# Patient Record
Sex: Female | Born: 1960 | Race: White | Hispanic: No | Marital: Single | Smoking: Never smoker
Health system: Southern US, Community
[De-identification: ages and names within clinical notes are randomized; demographics above are authoritative.]

## PROBLEM LIST (undated history)

## (undated) DIAGNOSIS — E079 Disorder of thyroid, unspecified: Secondary | ICD-10-CM

## (undated) HISTORY — PX: TONSILLECTOMY: SUR1361

## (undated) HISTORY — DX: Disorder of thyroid, unspecified: E07.9

---
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
## 1984-04-30 HISTORY — PX: NASAL SINUS SURGERY: SHX719

## 1984-04-30 HISTORY — PX: TUBAL LIGATION: SHX77

## 1997-10-03 ENCOUNTER — Emergency Department (HOSPITAL_COMMUNITY): Admission: EM | Admit: 1997-10-03 | Discharge: 1997-10-03 | Payer: Self-pay | Admitting: Emergency Medicine

## 1998-08-10 ENCOUNTER — Other Ambulatory Visit: Admission: RE | Admit: 1998-08-10 | Discharge: 1998-08-10 | Payer: Self-pay | Admitting: Obstetrics and Gynecology

## 1999-10-26 ENCOUNTER — Emergency Department (HOSPITAL_COMMUNITY): Admission: EM | Admit: 1999-10-26 | Discharge: 1999-10-26 | Payer: Self-pay | Admitting: Emergency Medicine

## 2000-02-15 ENCOUNTER — Encounter: Payer: Self-pay | Admitting: Emergency Medicine

## 2000-02-15 ENCOUNTER — Emergency Department (HOSPITAL_COMMUNITY): Admission: EM | Admit: 2000-02-15 | Discharge: 2000-02-15 | Payer: Self-pay | Admitting: Emergency Medicine

## 2000-06-03 ENCOUNTER — Emergency Department (HOSPITAL_COMMUNITY): Admission: EM | Admit: 2000-06-03 | Discharge: 2000-06-03 | Payer: Self-pay | Admitting: Emergency Medicine

## 2000-06-03 ENCOUNTER — Encounter: Payer: Self-pay | Admitting: Emergency Medicine

## 2001-09-08 ENCOUNTER — Ambulatory Visit (HOSPITAL_BASED_OUTPATIENT_CLINIC_OR_DEPARTMENT_OTHER): Admission: RE | Admit: 2001-09-08 | Discharge: 2001-09-08 | Payer: Self-pay | Admitting: *Deleted

## 2002-07-27 ENCOUNTER — Emergency Department (HOSPITAL_COMMUNITY): Admission: AD | Admit: 2002-07-27 | Discharge: 2002-07-27 | Payer: Self-pay | Admitting: Emergency Medicine

## 2002-07-30 ENCOUNTER — Other Ambulatory Visit: Admission: RE | Admit: 2002-07-30 | Discharge: 2002-07-30 | Payer: Self-pay | Admitting: Family Medicine

## 2002-08-04 ENCOUNTER — Encounter: Admission: RE | Admit: 2002-08-04 | Discharge: 2002-08-04 | Payer: Self-pay | Admitting: Family Medicine

## 2002-08-04 ENCOUNTER — Encounter: Payer: Self-pay | Admitting: Family Medicine

## 2003-01-02 ENCOUNTER — Emergency Department (HOSPITAL_COMMUNITY): Admission: EM | Admit: 2003-01-02 | Discharge: 2003-01-02 | Payer: Self-pay | Admitting: Emergency Medicine

## 2003-08-03 ENCOUNTER — Other Ambulatory Visit: Admission: RE | Admit: 2003-08-03 | Discharge: 2003-08-03 | Payer: Self-pay | Admitting: Family Medicine

## 2004-03-14 ENCOUNTER — Ambulatory Visit: Payer: Self-pay | Admitting: Family Medicine

## 2004-07-04 ENCOUNTER — Ambulatory Visit: Payer: Self-pay | Admitting: Family Medicine

## 2004-07-07 ENCOUNTER — Emergency Department (HOSPITAL_COMMUNITY): Admission: EM | Admit: 2004-07-07 | Discharge: 2004-07-07 | Payer: Self-pay | Admitting: Emergency Medicine

## 2004-07-10 ENCOUNTER — Ambulatory Visit: Payer: Self-pay | Admitting: Family Medicine

## 2004-07-12 ENCOUNTER — Encounter: Admission: RE | Admit: 2004-07-12 | Discharge: 2004-07-12 | Payer: Self-pay | Admitting: Family Medicine

## 2004-08-17 ENCOUNTER — Ambulatory Visit: Payer: Self-pay | Admitting: Family Medicine

## 2004-08-28 ENCOUNTER — Ambulatory Visit: Payer: Self-pay | Admitting: Family Medicine

## 2004-09-04 ENCOUNTER — Other Ambulatory Visit: Admission: RE | Admit: 2004-09-04 | Discharge: 2004-09-04 | Payer: Self-pay | Admitting: Family Medicine

## 2004-09-04 ENCOUNTER — Ambulatory Visit: Payer: Self-pay | Admitting: Family Medicine

## 2005-01-18 ENCOUNTER — Ambulatory Visit: Payer: Self-pay | Admitting: Family Medicine

## 2010-04-30 HISTORY — PX: ABDOMINAL HYSTERECTOMY: SHX81

## 2012-03-17 ENCOUNTER — Encounter: Payer: Self-pay | Admitting: Family Medicine

## 2012-03-18 ENCOUNTER — Encounter: Payer: Self-pay | Admitting: Family Medicine

## 2012-05-27 ENCOUNTER — Ambulatory Visit (INDEPENDENT_AMBULATORY_CARE_PROVIDER_SITE_OTHER): Payer: No Typology Code available for payment source | Admitting: Family Medicine

## 2012-05-27 ENCOUNTER — Encounter: Payer: Self-pay | Admitting: Family Medicine

## 2012-05-27 ENCOUNTER — Other Ambulatory Visit (HOSPITAL_COMMUNITY)
Admission: RE | Admit: 2012-05-27 | Discharge: 2012-05-27 | Disposition: A | Discharge status: Home / Self Care | Payer: No Typology Code available for payment source | Source: Ambulatory Visit | Attending: Family Medicine | Admitting: Family Medicine

## 2012-05-27 VITALS — BP 118/72 | HR 68 | Temp 97.9°F | Ht 64.25 in | Wt 179.2 lb

## 2012-05-27 DIAGNOSIS — Z1239 Encounter for other screening for malignant neoplasm of breast: Secondary | ICD-10-CM

## 2012-05-27 DIAGNOSIS — E039 Hypothyroidism, unspecified: Secondary | ICD-10-CM

## 2012-05-27 DIAGNOSIS — Z01419 Encounter for gynecological examination (general) (routine) without abnormal findings: Secondary | ICD-10-CM | POA: Insufficient documentation

## 2012-05-27 DIAGNOSIS — Z0000 Encounter for general adult medical examination without abnormal findings: Secondary | ICD-10-CM

## 2012-05-27 LAB — LIPID PANEL
Cholesterol: 154 mg/dL (ref 0–200)
HDL: 53.5 mg/dL (ref >39.00)
LDL Cholesterol: 91 mg/dL (ref 0–99)
Total CHOL/HDL Ratio: 3
Triglycerides: 48 mg/dL (ref 0.0–149.0)

## 2012-05-27 LAB — BASIC METABOLIC PANEL
BUN: 9 mg/dL (ref 6–23)
CO2: 29 mEq/L (ref 19–32)
Chloride: 102 mEq/L (ref 96–112)
Creatinine, Ser: 0.7 mg/dL (ref 0.4–1.2)
Glucose, Bld: 80 mg/dL (ref 70–99)
Potassium: 3.6 mEq/L (ref 3.5–5.1)

## 2012-05-27 LAB — CBC WITH DIFFERENTIAL/PLATELET
Eosinophils Absolute: 0.2 10*3/uL (ref 0.0–0.7)
Eosinophils Relative: 5.2 % — ABNORMAL HIGH (ref 0.0–5.0)
HCT: 37.8 % (ref 36.0–46.0)
Lymphs Abs: 1.4 10*3/uL (ref 0.7–4.0)
MCHC: 33.5 g/dL (ref 30.0–36.0)
MCV: 85.2 fl (ref 78.0–100.0)
Monocytes Absolute: 0.4 10*3/uL (ref 0.1–1.0)
Neutrophils Relative %: 55.6 % (ref 43.0–77.0)
Platelets: 245 10*3/uL (ref 150.0–400.0)
RDW: 14.8 % — ABNORMAL HIGH (ref 11.5–14.6)

## 2012-05-27 LAB — POCT URINALYSIS DIPSTICK
Bilirubin, UA: NEGATIVE
Blood, UA: NEGATIVE
Glucose, UA: NEGATIVE
Spec Grav, UA: <=1.005
Urobilinogen, UA: 0.2
pH, UA: 6

## 2012-05-27 LAB — HEPATIC FUNCTION PANEL
ALT: 45 U/L — ABNORMAL HIGH (ref 0–35)
Bilirubin, Direct: 0 mg/dL (ref 0.0–0.3)
Total Bilirubin: 0.5 mg/dL (ref 0.3–1.2)

## 2012-05-27 LAB — T3, FREE: T3, Free: 2.1 pg/mL — ABNORMAL LOW (ref 2.3–4.2)

## 2012-05-27 LAB — T4, FREE: Free T4: 0.94 ng/dL (ref 0.60–1.60)

## 2012-05-27 NOTE — Addendum Note (Signed)
Addended by: Arnette Norris on: 05/27/2012 11:31 AM   Modules accepted: Orders

## 2012-05-27 NOTE — Progress Notes (Signed)
  Subjective:     Gail Mccann is a 52 y.o. female and is here for a comprehensive physical exam. The patient reports no problems.  History   Social History  . Marital Status: Legally Separated    Spouse Name: N/A    Number of Children: N/A  . Years of Education: N/A   Occupational History  . [employer]    Social History Main Topics  . Smoking status: Never Smoker   . Smokeless tobacco: Never Used  . Alcohol Use: No  . Drug Use: No  . Sexually Active: Not on file   Other Topics Concern  . Not on file   Social History Narrative   Exercise-- walk   No health maintenance topics applied.  The following portions of the patient's history were reviewed and updated as appropriate: allergies, current medications, past family history, past medical history, past social history, past surgical history and problem list.  Review of Systems Review of Systems  Constitutional: Negative for activity change, appetite change and fatigue.  HENT: Negative for hearing loss, congestion, tinnitus and ear discharge.  dentist q59m Eyes: Negative for visual disturbance (see optho q1y -- vision corrected to 20/20 with glasses).  Respiratory: Negative for cough, chest tightness and shortness of breath.   Cardiovascular: Negative for chest pain, palpitations and leg swelling.  Gastrointestinal: Negative for abdominal pain, diarrhea, constipation and abdominal distention.  Genitourinary: Negative for urgency, frequency, decreased urine volume and difficulty urinating.  Musculoskeletal: Negative for back pain, arthralgias and gait problem.  Skin: Negative for color change, pallor and rash.  Neurological: Negative for dizziness, light-headedness, numbness and headaches.  Hematological: Negative for adenopathy. Does not bruise/bleed easily.  Psychiatric/Behavioral: Negative for suicidal ideas, confusion, sleep disturbance, self-injury, dysphoric mood, decreased concentration and agitation.       Objective:     BP 118/72  Pulse 68  Temp 97.9 F (36.6 C) (Oral)  Ht 5' 4.25" (1.632 m)  Wt 179 lb 3.2 oz (81.285 kg)  BMI 30.52 kg/m2  SpO2 99% General appearance: alert, cooperative, appears stated age and no distress Head: Normocephalic, without obvious abnormality, atraumatic Eyes: conjunctivae/corneas clear. PERRL, EOM's intact. Fundi benign. Ears: normal TM's and external ear canals both ears Nose: Nares normal. Septum midline. Mucosa normal. No drainage or sinus tenderness. Throat: lips, mucosa, and tongue normal; teeth and gums normal Neck: no adenopathy, no carotid bruit, no JVD, supple, symmetrical, trachea midline and thyroid not enlarged, symmetric, no tenderness/mass/nodules Back: symmetric, no curvature. ROM normal. No CVA tenderness. Lungs: clear to auscultation bilaterally Breasts: normal appearance, no masses or tenderness Heart: regular rate and rhythm, S1, S2 normal, no murmur, click, rub or gallop Abdomen: soft, non-tender; bowel sounds normal; no masses,  no organomegaly Pelvic: external genitalia normal, no adnexal masses or tenderness, rectovaginal septum normal, uterus surgically absent and vagina normal without discharge--vaginal smear done Extremities: extremities normal, atraumatic, no cyanosis or edema Pulses: 2+ and symmetric Skin: Skin color, texture, turgor normal. No rashes or lesions Lymph nodes: Cervical, supraclavicular, and axillary nodes normal. Neurologic: Alert and oriented X 3, normal strength and tone. Normal symmetric reflexes. Normal coordination and gait psych-- no depression, no anxiety    Assessment:    Healthy female exam.       Plan:    ghm utd Check labs See After Visit Summary for Counseling Recommendations

## 2012-05-27 NOTE — Assessment & Plan Note (Signed)
Check labs 

## 2012-05-27 NOTE — Patient Instructions (Signed)
Preventive Care for Adults, Female A healthy lifestyle and preventive care can promote health and wellness. Preventive health guidelines for women include the following key practices.  A routine yearly physical is a good way to check with your caregiver about your health and preventive screening. It is a chance to share any concerns and updates on your health, and to receive a thorough exam.  Visit your dentist for a routine exam and preventive care every 6 months. Brush your teeth twice a day and floss once a day. Good oral hygiene prevents tooth decay and gum disease.  The frequency of eye exams is based on your age, health, family medical history, use of contact lenses, and other factors. Follow your caregiver's recommendations for frequency of eye exams.  Eat a healthy diet. Foods like vegetables, fruits, whole grains, low-fat dairy products, and lean protein foods contain the nutrients you need without too many calories. Decrease your intake of foods high in solid fats, added sugars, and salt. Eat the right amount of calories for you.Get information about a proper diet from your caregiver, if necessary.  Regular physical exercise is one of the most important things you can do for your health. Most adults should get at least 150 minutes of moderate-intensity exercise (any activity that increases your heart rate and causes you to sweat) each week. In addition, most adults need muscle-strengthening exercises on 2 or more days a week.  Maintain a healthy weight. The body mass index (BMI) is a screening tool to identify possible weight problems. It provides an estimate of body fat based on height and weight. Your caregiver can help determine your BMI, and can help you achieve or maintain a healthy weight.For adults 20 years and older:  A BMI below 18.5 is considered underweight.  A BMI of 18.5 to 24.9 is normal.  A BMI of 25 to 29.9 is considered overweight.  A BMI of 30 and above is  considered obese.  Maintain normal blood lipids and cholesterol levels by exercising and minimizing your intake of saturated fat. Eat a balanced diet with plenty of fruit and vegetables. Blood tests for lipids and cholesterol should begin at age 20 and be repeated every 5 years. If your lipid or cholesterol levels are high, you are over 50, or you are at high risk for heart disease, you may need your cholesterol levels checked more frequently.Ongoing high lipid and cholesterol levels should be treated with medicines if diet and exercise are not effective.  If you smoke, find out from your caregiver how to quit. If you do not use tobacco, do not start.  If you are pregnant, do not drink alcohol. If you are breastfeeding, be very cautious about drinking alcohol. If you are not pregnant and choose to drink alcohol, do not exceed 1 drink per day. One drink is considered to be 12 ounces (355 mL) of beer, 5 ounces (148 mL) of wine, or 1.5 ounces (44 mL) of liquor.  Avoid use of street drugs. Do not share needles with anyone. Ask for help if you need support or instructions about stopping the use of drugs.  High blood pressure causes heart disease and increases the risk of stroke. Your blood pressure should be checked at least every 1 to 2 years. Ongoing high blood pressure should be treated with medicines if weight loss and exercise are not effective.  If you are 55 to 54 years old, ask your caregiver if you should take aspirin to prevent strokes.  Diabetes   screening involves taking a blood sample to check your fasting blood sugar level. This should be done once every 3 years, after age 45, if you are within normal weight and without risk factors for diabetes. Testing should be considered at a younger age or be carried out more frequently if you are overweight and have at least 1 risk factor for diabetes.  Breast cancer screening is essential preventive care for women. You should practice "breast  self-awareness." This means understanding the normal appearance and feel of your breasts and may include breast self-examination. Any changes detected, no matter how small, should be reported to a caregiver. Women in their 20s and 30s should have a clinical breast exam (CBE) by a caregiver as part of a regular health exam every 1 to 3 years. After age 40, women should have a CBE every year. Starting at age 40, women should consider having a mammography (breast X-ray test) every year. Women who have a family history of breast cancer should talk to their caregiver about genetic screening. Women at a high risk of breast cancer should talk to their caregivers about having magnetic resonance imaging (MRI) and a mammography every year.  The Pap test is a screening test for cervical cancer. A Pap test can show cell changes on the cervix that might become cervical cancer if left untreated. A Pap test is a procedure in which cells are obtained and examined from the lower end of the uterus (cervix).  Women should have a Pap test starting at age 21.  Between ages 21 and 29, Pap tests should be repeated every 2 years.  Beginning at age 30, you should have a Pap test every 3 years as long as the past 3 Pap tests have been normal.  Some women have medical problems that increase the chance of getting cervical cancer. Talk to your caregiver about these problems. It is especially important to talk to your caregiver if a new problem develops soon after your last Pap test. In these cases, your caregiver may recommend more frequent screening and Pap tests.  The above recommendations are the same for women who have or have not gotten the vaccine for human papillomavirus (HPV).  If you had a hysterectomy for a problem that was not cancer or a condition that could lead to cancer, then you no longer need Pap tests. Even if you no longer need a Pap test, a regular exam is a good idea to make sure no other problems are  starting.  If you are between ages 65 and 70, and you have had normal Pap tests going back 10 years, you no longer need Pap tests. Even if you no longer need a Pap test, a regular exam is a good idea to make sure no other problems are starting.  If you have had past treatment for cervical cancer or a condition that could lead to cancer, you need Pap tests and screening for cancer for at least 20 years after your treatment.  If Pap tests have been discontinued, risk factors (such as a new sexual partner) need to be reassessed to determine if screening should be resumed.  The HPV test is an additional test that may be used for cervical cancer screening. The HPV test looks for the virus that can cause the cell changes on the cervix. The cells collected during the Pap test can be tested for HPV. The HPV test could be used to screen women aged 30 years and older, and should   be used in women of any age who have unclear Pap test results. After the age of 30, women should have HPV testing at the same frequency as a Pap test.  Colorectal cancer can be detected and often prevented. Most routine colorectal cancer screening begins at the age of 50 and continues through age 75. However, your caregiver may recommend screening at an earlier age if you have risk factors for colon cancer. On a yearly basis, your caregiver may provide home test kits to check for hidden blood in the stool. Use of a small camera at the end of a tube, to directly examine the colon (sigmoidoscopy or colonoscopy), can detect the earliest forms of colorectal cancer. Talk to your caregiver about this at age 50, when routine screening begins. Direct examination of the colon should be repeated every 5 to 10 years through age 75, unless early forms of pre-cancerous polyps or small growths are found.  Hepatitis C blood testing is recommended for all people born from 1945 through 1965 and any individual with known risks for hepatitis C.  Practice  safe sex. Use condoms and avoid high-risk sexual practices to reduce the spread of sexually transmitted infections (STIs). STIs include gonorrhea, chlamydia, syphilis, trichomonas, herpes, HPV, and human immunodeficiency virus (HIV). Herpes, HIV, and HPV are viral illnesses that have no cure. They can result in disability, cancer, and death. Sexually active women aged 25 and younger should be checked for chlamydia. Older women with new or multiple partners should also be tested for chlamydia. Testing for other STIs is recommended if you are sexually active and at increased risk.  Osteoporosis is a disease in which the bones lose minerals and strength with aging. This can result in serious bone fractures. The risk of osteoporosis can be identified using a bone density scan. Women ages 65 and over and women at risk for fractures or osteoporosis should discuss screening with their caregivers. Ask your caregiver whether you should take a calcium supplement or vitamin D to reduce the rate of osteoporosis.  Menopause can be associated with physical symptoms and risks. Hormone replacement therapy is available to decrease symptoms and risks. You should talk to your caregiver about whether hormone replacement therapy is right for you.  Use sunscreen with sun protection factor (SPF) of 30 or more. Apply sunscreen liberally and repeatedly throughout the day. You should seek shade when your shadow is shorter than you. Protect yourself by wearing long sleeves, pants, a wide-brimmed hat, and sunglasses year round, whenever you are outdoors.  Once a month, do a whole body skin exam, using a mirror to look at the skin on your back. Notify your caregiver of new moles, moles that have irregular borders, moles that are larger than a pencil eraser, or moles that have changed in shape or color.  Stay current with required immunizations.  Influenza. You need a dose every fall (or winter). The composition of the flu vaccine  changes each year, so being vaccinated once is not enough.  Pneumococcal polysaccharide. You need 1 to 2 doses if you smoke cigarettes or if you have certain chronic medical conditions. You need 1 dose at age 65 (or older) if you have never been vaccinated.  Tetanus, diphtheria, pertussis (Tdap, Td). Get 1 dose of Tdap vaccine if you are younger than age 65, are over 65 and have contact with an infant, are a healthcare worker, are pregnant, or simply want to be protected from whooping cough. After that, you need a Td   booster dose every 10 years. Consult your caregiver if you have not had at least 3 tetanus and diphtheria-containing shots sometime in your life or have a deep or dirty wound.  HPV. You need this vaccine if you are a woman age 26 or younger. The vaccine is given in 3 doses over 6 months.  Measles, mumps, rubella (MMR). You need at least 1 dose of MMR if you were born in 1957 or later. You may also need a second dose.  Meningococcal. If you are age 19 to 21 and a first-year college student living in a residence hall, or have one of several medical conditions, you need to get vaccinated against meningococcal disease. You may also need additional booster doses.  Zoster (shingles). If you are age 60 or older, you should get this vaccine.  Varicella (chickenpox). If you have never had chickenpox or you were vaccinated but received only 1 dose, talk to your caregiver to find out if you need this vaccine.  Hepatitis A. You need this vaccine if you have a specific risk factor for hepatitis A virus infection or you simply wish to be protected from this disease. The vaccine is usually given as 2 doses, 6 to 18 months apart.  Hepatitis B. You need this vaccine if you have a specific risk factor for hepatitis B virus infection or you simply wish to be protected from this disease. The vaccine is given in 3 doses, usually over 6 months. Preventive Services / Frequency Ages 19 to 39  Blood  pressure check.** / Every 1 to 2 years.  Lipid and cholesterol check.** / Every 5 years beginning at age 20.  Clinical breast exam.** / Every 3 years for women in their 20s and 30s.  Pap test.** / Every 2 years from ages 21 through 29. Every 3 years starting at age 30 through age 65 or 70 with a history of 3 consecutive normal Pap tests.  HPV screening.** / Every 3 years from ages 30 through ages 65 to 70 with a history of 3 consecutive normal Pap tests.  Hepatitis C blood test.** / For any individual with known risks for hepatitis C.  Skin self-exam. / Monthly.  Influenza immunization.** / Every year.  Pneumococcal polysaccharide immunization.** / 1 to 2 doses if you smoke cigarettes or if you have certain chronic medical conditions.  Tetanus, diphtheria, pertussis (Tdap, Td) immunization. / A one-time dose of Tdap vaccine. After that, you need a Td booster dose every 10 years.  HPV immunization. / 3 doses over 6 months, if you are 26 and younger.  Measles, mumps, rubella (MMR) immunization. / You need at least 1 dose of MMR if you were born in 1957 or later. You may also need a second dose.  Meningococcal immunization. / 1 dose if you are age 19 to 21 and a first-year college student living in a residence hall, or have one of several medical conditions, you need to get vaccinated against meningococcal disease. You may also need additional booster doses.  Varicella immunization.** / Consult your caregiver.  Hepatitis A immunization.** / Consult your caregiver. 2 doses, 6 to 18 months apart.  Hepatitis B immunization.** / Consult your caregiver. 3 doses usually over 6 months. Ages 40 to 64  Blood pressure check.** / Every 1 to 2 years.  Lipid and cholesterol check.** / Every 5 years beginning at age 20.  Clinical breast exam.** / Every year after age 40.  Mammogram.** / Every year beginning at age 40   and continuing for as long as you are in good health. Consult with your  caregiver.  Pap test.** / Every 3 years starting at age 30 through age 65 or 70 with a history of 3 consecutive normal Pap tests.  HPV screening.** / Every 3 years from ages 30 through ages 65 to 70 with a history of 3 consecutive normal Pap tests.  Fecal occult blood test (FOBT) of stool. / Every year beginning at age 50 and continuing until age 75. You may not need to do this test if you get a colonoscopy every 10 years.  Flexible sigmoidoscopy or colonoscopy.** / Every 5 years for a flexible sigmoidoscopy or every 10 years for a colonoscopy beginning at age 50 and continuing until age 75.  Hepatitis C blood test.** / For all people born from 1945 through 1965 and any individual with known risks for hepatitis C.  Skin self-exam. / Monthly.  Influenza immunization.** / Every year.  Pneumococcal polysaccharide immunization.** / 1 to 2 doses if you smoke cigarettes or if you have certain chronic medical conditions.  Tetanus, diphtheria, pertussis (Tdap, Td) immunization.** / A one-time dose of Tdap vaccine. After that, you need a Td booster dose every 10 years.  Measles, mumps, rubella (MMR) immunization. / You need at least 1 dose of MMR if you were born in 1957 or later. You may also need a second dose.  Varicella immunization.** / Consult your caregiver.  Meningococcal immunization.** / Consult your caregiver.  Hepatitis A immunization.** / Consult your caregiver. 2 doses, 6 to 18 months apart.  Hepatitis B immunization.** / Consult your caregiver. 3 doses, usually over 6 months. Ages 65 and over  Blood pressure check.** / Every 1 to 2 years.  Lipid and cholesterol check.** / Every 5 years beginning at age 20.  Clinical breast exam.** / Every year after age 40.  Mammogram.** / Every year beginning at age 40 and continuing for as long as you are in good health. Consult with your caregiver.  Pap test.** / Every 3 years starting at age 30 through age 65 or 70 with a 3  consecutive normal Pap tests. Testing can be stopped between 65 and 70 with 3 consecutive normal Pap tests and no abnormal Pap or HPV tests in the past 10 years.  HPV screening.** / Every 3 years from ages 30 through ages 65 or 70 with a history of 3 consecutive normal Pap tests. Testing can be stopped between 65 and 70 with 3 consecutive normal Pap tests and no abnormal Pap or HPV tests in the past 10 years.  Fecal occult blood test (FOBT) of stool. / Every year beginning at age 50 and continuing until age 75. You may not need to do this test if you get a colonoscopy every 10 years.  Flexible sigmoidoscopy or colonoscopy.** / Every 5 years for a flexible sigmoidoscopy or every 10 years for a colonoscopy beginning at age 50 and continuing until age 75.  Hepatitis C blood test.** / For all people born from 1945 through 1965 and any individual with known risks for hepatitis C.  Osteoporosis screening.** / A one-time screening for women ages 65 and over and women at risk for fractures or osteoporosis.  Skin self-exam. / Monthly.  Influenza immunization.** / Every year.  Pneumococcal polysaccharide immunization.** / 1 dose at age 65 (or older) if you have never been vaccinated.  Tetanus, diphtheria, pertussis (Tdap, Td) immunization. / A one-time dose of Tdap vaccine if you are over   65 and have contact with an infant, are a healthcare worker, or simply want to be protected from whooping cough. After that, you need a Td booster dose every 10 years.  Varicella immunization.** / Consult your caregiver.  Meningococcal immunization.** / Consult your caregiver.  Hepatitis A immunization.** / Consult your caregiver. 2 doses, 6 to 18 months apart.  Hepatitis B immunization.** / Check with your caregiver. 3 doses, usually over 6 months. ** Family history and personal history of risk and conditions may change your caregiver's recommendations. Document Released: 06/12/2001 Document Revised: 07/09/2011  Document Reviewed: 09/11/2010 [employer] Patient Information 2013 [employer].  

## 2012-05-28 ENCOUNTER — Encounter: Payer: Self-pay | Admitting: Gastroenterology

## 2012-05-29 ENCOUNTER — Telehealth: Payer: Self-pay | Admitting: Family Medicine

## 2012-05-29 MED ORDER — LEVOTHYROXINE SODIUM 75 MCG PO TABS: 75.0000 ug | ORAL_TABLET | Freq: Every day | ORAL | Status: DC

## 2012-05-29 NOTE — Telephone Encounter (Signed)
Pt denies that she is taking any meds to cause a elevation in hep panel. Pt notes that every time she has this done her levels seem to be elevated in the past.   Hep panel elevated---- any etoh, tylenol or other otc meds? If yes, Stop it and recheck 2 weeks ---790.4 Hep, ggt, acute hep con't current thyroid med

## 2012-05-29 NOTE — Telephone Encounter (Signed)
Pt returned you call, she is going to work at [employer] and hoped you would call before this.

## 2012-06-17 ENCOUNTER — Ambulatory Visit (INDEPENDENT_AMBULATORY_CARE_PROVIDER_SITE_OTHER): Payer: No Typology Code available for payment source

## 2012-06-17 DIAGNOSIS — Z1231 Encounter for screening mammogram for malignant neoplasm of breast: Secondary | ICD-10-CM

## 2012-06-17 DIAGNOSIS — Z1239 Encounter for other screening for malignant neoplasm of breast: Secondary | ICD-10-CM

## 2012-07-01 ENCOUNTER — Encounter: Payer: No Typology Code available for payment source | Admitting: Gastroenterology

## 2012-12-09 ENCOUNTER — Telehealth: Payer: Self-pay | Admitting: Family Medicine

## 2012-12-09 MED ORDER — LEVOTHYROXINE SODIUM 75 MCG PO TABS: 75.0000 ug | ORAL_TABLET | Freq: Every day | ORAL | Status: DC

## 2012-12-09 NOTE — Telephone Encounter (Signed)
Patient is calling in to request a refill on her Synthroid rx. Wants to know if she needs to come in for a thyroid panel. She says that she has to pay out-of-pocket for her visits so she prefers not to make an appointment to see the doctor. States that we can reach her back at [phone] and leave a detailed message if she does not answer.

## 2012-12-09 NOTE — Telephone Encounter (Signed)
Her TSH is not due until Jan.   KP

## 2013-01-13 ENCOUNTER — Telehealth: Payer: Self-pay | Admitting: Family Medicine

## 2013-01-13 NOTE — Telephone Encounter (Signed)
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 Her employer is asking that she get a note from her physician stating this so she can have frequent bathroom breaks. Pt will come pick up. Okay to leave a message, patient cannot answer phone at work. Patient gets off work @ 3:30pm

## 2013-01-14 NOTE — Telephone Encounter (Signed)
Please advise. There is no mention of this in pt's chart, kim also looked in centric[facility] and pt has no record on file.

## 2013-01-14 NOTE — Telephone Encounter (Signed)
Let pt know we have no record of testing ---does she know where and when they were done?

## 2013-01-14 NOTE — Telephone Encounter (Signed)
Pt returned call stating that she had seen [facility] in [facility] 4-5 years ago and was seen the urology office there.

## 2013-01-14 NOTE — Telephone Encounter (Signed)
There is no [facility] in [employer]

## 2013-01-14 NOTE — Telephone Encounter (Signed)
Called and Left a message for pt to return call.

## 2013-01-19 NOTE — Telephone Encounter (Signed)
Spoke with pt and advised we had no record of this. Advised that we would have to begin testing all over again. Pt stated that would think about it.

## 2013-06-30 ENCOUNTER — Other Ambulatory Visit: Payer: Self-pay | Admitting: Family Medicine

## 2013-07-21 ENCOUNTER — Encounter: Payer: Self-pay | Admitting: Family Medicine

## 2013-07-21 ENCOUNTER — Ambulatory Visit (INDEPENDENT_AMBULATORY_CARE_PROVIDER_SITE_OTHER): Payer: No Typology Code available for payment source | Admitting: Family Medicine

## 2013-07-21 VITALS — BP 100/60 | HR 68 | Temp 97.6°F | Ht 64.25 in | Wt 209.1 lb

## 2013-07-21 DIAGNOSIS — J209 Acute bronchitis, unspecified: Secondary | ICD-10-CM

## 2013-07-21 MED ORDER — DOXYCYCLINE HYCLATE 100 MG PO TABS: 100.0000 mg | ORAL_TABLET | Freq: Two times a day (BID) | ORAL | Status: DC

## 2013-07-21 NOTE — Patient Instructions (Signed)
Probiotics daily for one month, a generic or Digestive Advantage  Bronchitis Bronchitis is inflammation of the airways that extend from the windpipe into the lungs (bronchi). The inflammation often causes mucus to develop, which leads to a cough. If the inflammation becomes severe, it may cause shortness of breath. CAUSES  Bronchitis may be caused by:   Viral infections.   Bacteria.   Cigarette smoke.   Allergens, pollutants, and other irritants.  SIGNS AND SYMPTOMS  The most common symptom of bronchitis is a frequent cough that produces mucus. Other symptoms include:  Fever.   Body aches.   Chest congestion.   Chills.   Shortness of breath.   Sore throat.  DIAGNOSIS  Bronchitis is usually diagnosed through a medical history and physical exam. Tests, such as chest X-rays, are sometimes done to rule out other conditions.  TREATMENT  You may need to avoid contact with whatever caused the problem (smoking, for example). Medicines are sometimes needed. These may include:  Antibiotics. These may be prescribed if the condition is caused by bacteria.  Cough suppressants. These may be prescribed for relief of cough symptoms.   Inhaled medicines. These may be prescribed to help open your airways and make it easier for you to breathe.   Steroid medicines. These may be prescribed for those with recurrent (chronic) bronchitis. HOME CARE INSTRUCTIONS  Get plenty of rest.   Drink enough fluids to keep your urine clear or pale yellow (unless you have a medical condition that requires fluid restriction). Increasing fluids may help thin your secretions and will prevent dehydration.   Only take over-the-counter or prescription medicines as directed by your health care provider.  Only take antibiotics as directed. Make sure you finish them even if you start to feel better.  Avoid secondhand smoke, irritating chemicals, and strong fumes. These will make bronchitis worse.  If you are a smoker, quit smoking. Consider using nicotine gum or skin patches to help control withdrawal symptoms. Quitting smoking will help your lungs heal faster.   Put a cool-mist humidifier in your bedroom at night to moisten the air. This may help loosen mucus. Change the water in the humidifier daily. You can also run the hot water in your shower and sit in the bathroom with the door closed for 5 10 minutes.   Follow up with your health care provider as directed.   Wash your hands frequently to avoid catching bronchitis again or spreading an infection to others.  SEEK MEDICAL CARE IF: Your symptoms do not improve after 1 week of treatment.  SEEK IMMEDIATE MEDICAL CARE IF:  Your fever increases.  You have chills.   You have chest pain.   You have worsening shortness of breath.   You have bloody sputum.  You faint.  You have lightheadedness.  You have a severe headache.   You vomit repeatedly. MAKE SURE YOU:   Understand these instructions.  Will watch your condition.  Will get help right away if you are not doing well or get worse. Document Released: 04/16/2005 Document Revised: 02/04/2013 Document Reviewed: 12/09/2012 [facility] Patient Information 2014 [place], [place].

## 2013-07-22 ENCOUNTER — Telehealth: Payer: Self-pay

## 2013-07-22 NOTE — Telephone Encounter (Signed)
Can have Hydromet 1 tsp po bid prn cough, disp #140 ML

## 2013-07-22 NOTE — Telephone Encounter (Signed)
The patient was seen on 07/21/13 by Dr. Abner GreenspanBlyth.  The patient states she was offered something for a cough, but did not need at the time.  She states now is coughing constantly and chest is hurting.  The [facility] [place] [facility] [place] [facility] [place]   Patient call back number once sent in is [phone].  Advise please

## 2013-07-23 MED ORDER — HYDROCODONE-HOMATROPINE 5-1.5 MG/5ML PO SYRP: 5.0000 mL | ORAL_SOLUTION | Freq: Two times a day (BID) | ORAL | Status: DC | PRN

## 2013-07-23 NOTE — Addendum Note (Signed)
Addended by: Court JoyFREEMAN, Matilda Fleig L on: 07/23/2013 04:56 PM   Modules accepted: Orders

## 2013-07-23 NOTE — Telephone Encounter (Signed)
RX sent and Candise BowensJen informed pt

## 2013-07-26 ENCOUNTER — Encounter: Payer: Self-pay | Admitting: Family Medicine

## 2013-07-26 DIAGNOSIS — J209 Acute bronchitis, unspecified: Secondary | ICD-10-CM | POA: Insufficient documentation

## 2013-07-26 NOTE — Progress Notes (Signed)
Patient ID: Gail Mccann, female   DOB: [birth year], 53 y.o.   MRN: [id number] Gail Mccann [id number] [birth year] 07/26/2013      Progress Note-Follow Up  Subjective  Chief Complaint  Chief Complaint  Patient presents with  . Cough    for 2 wk, chest congestion, body aching, fatigue.  took Dayquil but not working.    HPI  Patient is a 53 year old female in today for routine medical care. In today complaining of 2 weeks worth of worsening cough, malaise and fatigue. She is struggling with some rhinorrhea which can be green at times. She's had some intermittent episodes of wheezing most notably when lying down. She did initially have some nausea with one episode of vomiting but that improved. Has a sense of feeling tight and short of breath at times. She has used DayQuil and NyQuil with some relief. Neck with to help her sleep last night. Denies throat pain or ear pain. Denies headache, GI or GU concerns at this time.  Past Medical History  Diagnosis Date  . Thyroid disease     Past Surgical History  Procedure Laterality Date  . Tubal ligation  1986  . Cesarean section      x's 2  . Abdominal hysterectomy  2012  . Tonsillectomy  1968  . Nasal sinus surgery  1986    Family History  Problem Relation Age of Onset  . Breast cancer Maternal Aunt   . Diabetes Maternal Grandmother   . Kidney disease Son     History   Social History  . Marital Status: Legally Separated    Spouse Name: N/A    Number of Children: N/A  . Years of Education: N/A   Occupational History  . [employer]    Social History Main Topics  . Smoking status: Never Smoker   . Smokeless tobacco: Never Used  . Alcohol Use: No  . Drug Use: No  . Sexual Activity: Not on file   Other Topics Concern  . Not on file   Social History Narrative   Exercise-- walk    Current Outpatient Prescriptions on File Prior to Visit  Medication Sig Dispense Refill  . Biotin 5000 MCG CAPS Take by mouth.      .  levothyroxine (SYNTHROID, LEVOTHROID) 75 MCG tablet TAKE ONE TABLET BY MOUTH ONCE DAILY  30 tablet  2  . Multiple Vitamin (MULTIVITAMIN) tablet Take 1 tablet by mouth daily.      . Calcium Carbonate-Vitamin D (CALCIUM 600 + D PO) Take by mouth.      . Melatonin 5 MG TABS Take by mouth.      Satira Sark. St Johns Wort 300 MG CAPS Take by mouth.       No current facility-administered medications on file prior to visit.    No Known Allergies  Review of Systems  Review of Systems  Constitutional: Positive for malaise/fatigue. Negative for fever.  HENT: Positive for congestion.   Eyes: Negative for discharge.  Respiratory: Positive for cough. Negative for shortness of breath.   Cardiovascular: Negative for chest pain, palpitations and leg swelling.  Gastrointestinal: Negative for nausea, abdominal pain and diarrhea.  Genitourinary: Negative for dysuria.  Musculoskeletal: Positive for myalgias. Negative for falls.  Skin: Negative for rash.  Neurological: Negative for loss of consciousness and headaches.  Endo/Heme/Allergies: Negative for polydipsia.  Psychiatric/Behavioral: Negative for depression and suicidal ideas. The patient is not nervous/anxious and does not have insomnia.     Objective  BP 100/60  Pulse 68  Temp(Src) 97.6 F (36.4 C) (Oral)  Ht 5' 4.25" (1.632 m)  Wt 209 lb 1.9 oz (94.856 kg)  BMI 35.61 kg/m2  SpO2 100%  Physical Exam  Physical Exam  Constitutional: She is oriented to person, place, and time and well-developed, well-nourished, and in no distress. No distress.  HENT:  Head: Normocephalic and atraumatic.  Eyes: Conjunctivae are normal.  Neck: Neck supple. No thyromegaly present.  Cardiovascular: Normal rate, regular rhythm and normal heart sounds.   No murmur heard. Pulmonary/Chest: Effort normal and breath sounds normal. She has no wheezes.  Rhonchi b/l bases  Abdominal: She exhibits no distension and no mass.  Musculoskeletal: She exhibits no edema.   Lymphadenopathy:    She has no cervical adenopathy.  Neurological: She is alert and oriented to person, place, and time.  Skin: Skin is warm and dry. No rash noted. She is not diaphoretic.  Psychiatric: Memory, affect and judgment normal.    Lab Results  Component Value Date   TSH 1.70 05/27/2012   Lab Results  Component Value Date   WBC 4.6 05/27/2012   HGB 12.7 05/27/2012   HCT 37.8 05/27/2012   MCV 85.2 05/27/2012   PLT 245.0 05/27/2012   Lab Results  Component Value Date   CREATININE 0.7 05/27/2012   BUN 9 05/27/2012   NA 138 05/27/2012   K 3.6 05/27/2012   CL 102 05/27/2012   CO2 29 05/27/2012   Lab Results  Component Value Date   ALT 45* 05/27/2012   AST 39* 05/27/2012   ALKPHOS 117 05/27/2012   BILITOT 0.5 05/27/2012   Lab Results  Component Value Date   CHOL 154 05/27/2012   Lab Results  Component Value Date   HDL 53.50 05/27/2012   Lab Results  Component Value Date   LDLCALC 91 05/27/2012   Lab Results  Component Value Date   TRIG 48.0 05/27/2012   Lab Results  Component Value Date   CHOLHDL 3 05/27/2012     Assessment & Plan  Acute bronchitis Encouraged increased rest and hydration, add probiotics, zinc such as Coldeze or Xicam. Treat fevers as needed. Given rx of Azithromycin

## 2013-07-26 NOTE — Assessment & Plan Note (Signed)
Encouraged increased rest and hydration, add probiotics, zinc such as Coldeze or Xicam. Treat fevers as needed. Given rx of Azithromycin

## 2013-07-27 ENCOUNTER — Other Ambulatory Visit: Payer: Self-pay | Admitting: Family Medicine

## 2013-07-27 ENCOUNTER — Telehealth: Payer: Self-pay | Admitting: *Deleted

## 2013-07-27 DIAGNOSIS — R05 Cough: Secondary | ICD-10-CM

## 2013-07-27 DIAGNOSIS — R059 Cough, unspecified: Secondary | ICD-10-CM

## 2013-07-27 MED ORDER — HYDROCODONE-HOMATROPINE 5-1.5 MG/5ML PO SYRP: 5.0000 mL | ORAL_SOLUTION | Freq: Two times a day (BID) | ORAL | Status: DC | PRN

## 2013-07-27 NOTE — Telephone Encounter (Signed)
Spoke with patient after she called our office very upset that the cough medicine that was prescribed was not at the drug store.In reviewing the chart, it appears it was faxed.  I advised the patient that this medication must be picked up due to the codeine it contains.Patient would like to come by this evening to pick up script for hydromet if possible due to ongoing cough.     Patient also stated she is unable to tolerate the doxy due to severe nausea with or without food. (pt reports ongoing non productive cough but no fever or chills) She has stopped taking it and wants to know if she needs a different one.

## 2013-07-27 NOTE — Telephone Encounter (Signed)
printed

## 2013-07-27 NOTE — Telephone Encounter (Signed)
Pt informed by Judeth CornfieldStephanie that RX is ready. Judeth CornfieldStephanie grabbed [employer] and took to front desk

## 2013-07-27 NOTE — Telephone Encounter (Signed)
RX is at the front desk. I did fax the original didn't realize it was Hydrocodone. that was on Thursday the 26th. Left a message for patient to return our call.

## 2013-07-29 ENCOUNTER — Telehealth: Payer: Self-pay

## 2013-07-29 NOTE — Telephone Encounter (Signed)
Left message for call back Non-identifiable   Pap- 05/27/12- negative MMG- 06/17/12- negative Flu Td-

## 2013-07-30 ENCOUNTER — Encounter: Payer: No Typology Code available for payment source | Admitting: Family Medicine

## 2013-07-30 ENCOUNTER — Telehealth: Payer: Self-pay

## 2013-07-30 NOTE — Telephone Encounter (Signed)
Call passed  to me due to patient upset that her appt was cancelled. Explained to patient that provider has become ill and that the FNP filled up at this point. Patient refuses to go to [facility] office. States that she feels the need to change offices due to continuous issues. States that she wants to be seen today. [facility].

## 2013-08-03 NOTE — Telephone Encounter (Signed)
Appointment was rescheduled.

## 2013-08-06 ENCOUNTER — Ambulatory Visit (INDEPENDENT_AMBULATORY_CARE_PROVIDER_SITE_OTHER): Payer: No Typology Code available for payment source | Admitting: Family Medicine

## 2013-08-06 ENCOUNTER — Encounter: Payer: Self-pay | Admitting: Family Medicine

## 2013-08-06 VITALS — BP 118/82 | HR 78 | Temp 97.8°F | Wt 211.0 lb

## 2013-08-06 DIAGNOSIS — R0602 Shortness of breath: Secondary | ICD-10-CM

## 2013-08-06 DIAGNOSIS — R059 Cough, unspecified: Secondary | ICD-10-CM

## 2013-08-06 DIAGNOSIS — R05 Cough: Secondary | ICD-10-CM

## 2013-08-06 DIAGNOSIS — R062 Wheezing: Secondary | ICD-10-CM

## 2013-08-06 DIAGNOSIS — E039 Hypothyroidism, unspecified: Secondary | ICD-10-CM

## 2013-08-06 LAB — T3, FREE: T3 FREE: 2.6 pg/mL (ref 2.3–4.2)

## 2013-08-06 LAB — T4, FREE: Free T4: 0.85 ng/dL (ref 0.60–1.60)

## 2013-08-06 LAB — TSH: TSH: 1.5 u[IU]/mL (ref 0.35–5.50)

## 2013-08-06 MED ORDER — METHYLPREDNISOLONE ACETATE 80 MG/ML IJ SUSP
80.0000 mg | Freq: Once | INTRAMUSCULAR | Status: AC
  Administered 2013-08-06: 80 mg via INTRAMUSCULAR

## 2013-08-06 MED ORDER — LIOTHYRONINE SODIUM 25 MCG PO TABS: 25.0000 ug | ORAL_TABLET | Freq: Every day | ORAL | Status: DC

## 2013-08-06 MED ORDER — PREDNISONE 10 MG PO TABS: ORAL_TABLET | ORAL | Status: DC

## 2013-08-06 MED ORDER — ALBUTEROL SULFATE (2.5 MG/3ML) 0.083% IN NEBU
2.5000 mg | INHALATION_SOLUTION | Freq: Once | RESPIRATORY_TRACT | Status: AC
  Administered 2013-08-06: 2.5 mg via RESPIRATORY_TRACT

## 2013-08-06 NOTE — Progress Notes (Signed)
  Subjective:     Gail Mccann is a 53 y.o. female here for evaluation of a cough. Onset of symptoms was several weeks ago. Symptoms have been gradually worsening since that time. The cough is barky and dry and is aggravated by nothing. Associated symptoms include: chills, postnasal drip and wheezing. Patient does not have a history of asthma. Patient does not have a history of environmental allergens. Patient has not traveled recently. Patient does not have a history of smoking. Patient has not had a previous chest x-ray. Patient has not had a PPD done.  The following portions of the patient's history were reviewed and updated as appropriate: allergies, current medications, past family history, past medical history, past social history, past surgical history and problem list.  Review of Systems Pertinent items are noted in HPI.    Objective:    Oxygen saturation 98% on room air BP 118/82  Pulse 78  Temp(Src) 97.8 F (36.6 C) (Oral)  Wt 211 lb (95.709 kg)  SpO2 98% General appearance: alert, cooperative, appears stated age and no distress Ears: normal TM's and external ear canals both ears Nose: Nares normal. Septum midline. Mucosa normal. No drainage or sinus tenderness. Throat: lips, mucosa, and tongue normal; teeth and gums normal Neck: mild anterior cervical adenopathy, supple, symmetrical, trachea midline and thyroid not enlarged, symmetric, no tenderness/mass/nodules Lungs: wheezes bilaterally Heart: S1, S2 normal    Assessment:    Reactive Airway Disease    Plan:    Explained lack of efficacy of antibiotics in viral disease. Antitussives per medication orders. Avoid exposure to tobacco smoke and fumes. Call if shortness of breath worsens, blood in sputum, change in character of cough, development of fever or chills, inability to maintain nutrition and hydration. Avoid exposure to tobacco smoke and fumes. Chest x-ray.   1. Wheezing  - albuterol (PROVENTIL) (2.5 MG/3ML)  0.083% nebulizer solution 2.5 mg; Take 3 mLs (2.5 mg total) by nebulization once. - methylPREDNISolone acetate (DEPO-MEDROL) injection 80 mg; Inject 1 mL (80 mg total) into the muscle once.  2. SOB (shortness of breath)  - albuterol (PROVENTIL) (2.5 MG/3ML) 0.083% nebulizer solution 2.5 mg; Take 3 mLs (2.5 mg total) by nebulization once. - methylPREDNISolone acetate (DEPO-MEDROL) injection 80 mg; Inject 1 mL (80 mg total) into the muscle once.  3. Cough  - DG Chest 2 View; Future - predniSONE (DELTASONE) 10 MG tablet; 3 po qd for 3 days then 2 po qd for 3 days the 1 po qd for 3 days  Dispense: 18 tablet; Refill: 0  4. Unspecified hypothyroidism Check labs--- r/s cpe - TSH - T3, free - T4, free - liothyronine (CYTOMEL) 25 MCG tablet; Take 1 tablet (25 mcg total) by mouth daily.  Dispense: 90 tablet; Refill: 3

## 2013-08-06 NOTE — Progress Notes (Signed)
Pre visit review using our clinic review tool, if applicable. No additional management support is needed unless otherwise documented below in the visit note. 

## 2013-08-06 NOTE — Patient Instructions (Addendum)
Take over the counter antihistamine for drainage and allergies.  You can also take flonase or nasacort if needed.  Start prednisone tomorrow.     Cough, Adult  A cough is a reflex that helps clear your throat and airways. It can help heal the body or may be a reaction to an irritated airway. A cough may only last 2 or 3 weeks (acute) or may last more than 8 weeks (chronic).  CAUSES Acute cough:  Viral or bacterial infections. Chronic cough:  Infections.  Allergies.  Asthma.  Post-nasal drip.  Smoking.  Heartburn or acid reflux.  Some medicines.  Chronic lung problems (COPD).  Cancer. SYMPTOMS   Cough.  Fever.  Chest pain.  Increased breathing rate.  High-pitched whistling sound when breathing (wheezing).  Colored mucus that you cough up (sputum). TREATMENT   A bacterial cough may be treated with antibiotic medicine.  A viral cough must run its course and will not respond to antibiotics.  Your caregiver may recommend other treatments if you have a chronic cough. HOME CARE INSTRUCTIONS   Only take over-the-counter or prescription medicines for pain, discomfort, or fever as directed by your caregiver. Use cough suppressants only as directed by your caregiver.  Use a cold steam vaporizer or humidifier in your bedroom or home to help loosen secretions.  Sleep in a semi-upright position if your cough is worse at night.  Rest as needed.  Stop smoking if you smoke. SEEK IMMEDIATE MEDICAL CARE IF:   You have pus in your sputum.  Your cough starts to worsen.  You cannot control your cough with suppressants and are losing sleep.  You begin coughing up blood.  You have difficulty breathing.  You develop pain which is getting worse or is uncontrolled with medicine.  You have a fever. MAKE SURE YOU:   Understand these instructions.  Will watch your condition.  Will get help right away if you are not doing well or get worse. Document Released:  10/13/2010 Document Revised: 07/09/2011 Document Reviewed: 10/13/2010 [facility] Patient Information 2014 [place]Care, [place].

## 2013-08-10 ENCOUNTER — Encounter: Payer: No Typology Code available for payment source | Admitting: Family Medicine

## 2013-10-14 ENCOUNTER — Other Ambulatory Visit: Payer: Self-pay | Admitting: Family Medicine

## 2013-10-15 ENCOUNTER — Encounter: Payer: No Typology Code available for payment source | Admitting: Family Medicine

## 2013-12-16 ENCOUNTER — Encounter: Payer: Self-pay | Admitting: Family Medicine

## 2013-12-16 ENCOUNTER — Ambulatory Visit (INDEPENDENT_AMBULATORY_CARE_PROVIDER_SITE_OTHER): Payer: No Typology Code available for payment source | Admitting: Family Medicine

## 2013-12-16 VITALS — BP 118/76 | HR 77 | Temp 97.9°F | Ht 64.25 in | Wt 216.0 lb

## 2013-12-16 DIAGNOSIS — Z0000 Encounter for general adult medical examination without abnormal findings: Secondary | ICD-10-CM

## 2013-12-16 DIAGNOSIS — R05 Cough: Secondary | ICD-10-CM

## 2013-12-16 DIAGNOSIS — Z1239 Encounter for other screening for malignant neoplasm of breast: Secondary | ICD-10-CM

## 2013-12-16 DIAGNOSIS — R059 Cough, unspecified: Secondary | ICD-10-CM

## 2013-12-16 LAB — CBC WITH DIFFERENTIAL/PLATELET
Basophils Absolute: 0 10*3/uL (ref 0.0–0.1)
Basophils Relative: 0.6 % (ref 0.0–3.0)
EOS PCT: 3 % (ref 0.0–5.0)
Eosinophils Absolute: 0.1 10*3/uL (ref 0.0–0.7)
HCT: 41.1 % (ref 36.0–46.0)
Hemoglobin: 13.6 g/dL (ref 12.0–15.0)
LYMPHS PCT: 34.7 % (ref 12.0–46.0)
Lymphs Abs: 1.7 10*3/uL (ref 0.7–4.0)
MCHC: 33.1 g/dL (ref 30.0–36.0)
MCV: 82.3 fl (ref 78.0–100.0)
MONO ABS: 0.4 10*3/uL (ref 0.1–1.0)
Monocytes Relative: 7.9 % (ref 3.0–12.0)
NEUTROS PCT: 53.8 % (ref 43.0–77.0)
Neutro Abs: 2.7 10*3/uL (ref 1.4–7.7)
PLATELETS: 276 10*3/uL (ref 150.0–400.0)
RBC: 4.99 Mil/uL (ref 3.87–5.11)
RDW: 14.1 % (ref 11.5–15.5)
WBC: 4.9 10*3/uL (ref 4.0–10.5)

## 2013-12-16 LAB — HEPATIC FUNCTION PANEL
ALK PHOS: 110 U/L (ref 39–117)
ALT: 25 U/L (ref 0–35)
AST: 26 U/L (ref 0–37)
Albumin: 3.9 g/dL (ref 3.5–5.2)
BILIRUBIN DIRECT: 0 mg/dL (ref 0.0–0.3)
BILIRUBIN TOTAL: 0.7 mg/dL (ref 0.2–1.2)
Total Protein: 7.6 g/dL (ref 6.0–8.3)

## 2013-12-16 LAB — POCT URINALYSIS DIPSTICK
Bilirubin, UA: NEGATIVE
Glucose, UA: NEGATIVE
Ketones, UA: NEGATIVE
Leukocytes, UA: NEGATIVE
NITRITE UA: NEGATIVE
PROTEIN UA: NEGATIVE
RBC UA: NEGATIVE
Spec Grav, UA: <=1.005
UROBILINOGEN UA: 0.2
pH, UA: 6

## 2013-12-16 LAB — LIPID PANEL
CHOL/HDL RATIO: 2
Cholesterol: 171 mg/dL (ref 0–200)
HDL: 69.8 mg/dL (ref >39.00)
LDL CALC: 92 mg/dL (ref 0–99)
NONHDL: 101.2
TRIGLYCERIDES: 45 mg/dL (ref 0.0–149.0)
VLDL: 9 mg/dL (ref 0.0–40.0)

## 2013-12-16 LAB — BASIC METABOLIC PANEL
BUN: 17 mg/dL (ref 6–23)
CHLORIDE: 101 meq/L (ref 96–112)
CO2: 29 mEq/L (ref 19–32)
CREATININE: 0.6 mg/dL (ref 0.4–1.2)
Calcium: 9.3 mg/dL (ref 8.4–10.5)
GFR: 112.57 mL/min (ref >60.00)
Glucose, Bld: 76 mg/dL (ref 70–99)
Potassium: 3.8 mEq/L (ref 3.5–5.1)
Sodium: 137 mEq/L (ref 135–145)

## 2013-12-16 NOTE — Progress Notes (Signed)
Pre visit review using our clinic review tool, if applicable. No additional management support is needed unless otherwise documented below in the visit note. 

## 2013-12-16 NOTE — Patient Instructions (Signed)
Preventive Care for Adults A healthy lifestyle and preventive care can promote health and wellness. Preventive health guidelines for women include the following key practices.  A routine yearly physical is a good way to check with your health care provider about your health and preventive screening. It is a chance to share any concerns and updates on your health and to receive a thorough exam.  Visit your dentist for a routine exam and preventive care every 6 months. Brush your teeth twice a day and floss once a day. Good oral hygiene prevents tooth decay and gum disease.  The frequency of eye exams is based on your age, health, family medical history, use of contact lenses, and other factors. Follow your health care provider's recommendations for frequency of eye exams.  Eat a healthy diet. Foods like vegetables, fruits, whole grains, low-fat dairy products, and lean protein foods contain the nutrients you need without too many calories. Decrease your intake of foods high in solid fats, added sugars, and salt. Eat the right amount of calories for you.Get information about a proper diet from your health care provider, if necessary.  Regular physical exercise is one of the most important things you can do for your health. Most adults should get at least 150 minutes of moderate-intensity exercise (any activity that increases your heart rate and causes you to sweat) each week. In addition, most adults need muscle-strengthening exercises on 2 or more days a week.  Maintain a healthy weight. The body mass index (BMI) is a screening tool to identify possible weight problems. It provides an estimate of body fat based on height and weight. Your health care provider can find your BMI and can help you achieve or maintain a healthy weight.For adults 20 years and older:  A BMI below 18.5 is considered underweight.  A BMI of 18.5 to 24.9 is normal.  A BMI of 25 to 29.9 is considered overweight.  A BMI of  30 and above is considered obese.  Maintain normal blood lipids and cholesterol levels by exercising and minimizing your intake of saturated fat. Eat a balanced diet with plenty of fruit and vegetables. Blood tests for lipids and cholesterol should begin at age 76 and be repeated every 5 years. If your lipid or cholesterol levels are high, you are over 50, or you are at high risk for heart disease, you may need your cholesterol levels checked more frequently.Ongoing high lipid and cholesterol levels should be treated with medicines if diet and exercise are not working.  If you smoke, find out from your health care provider how to quit. If you do not use tobacco, do not start.  Lung cancer screening is recommended for adults aged 22-80 years who are at high risk for developing lung cancer because of a history of smoking. A yearly low-dose CT scan of the lungs is recommended for people who have at least a 30-pack-year history of smoking and are a current smoker or have quit within the past 15 years. A pack year of smoking is smoking an average of 1 pack of cigarettes a day for 1 year (for example: 1 pack a day for 30 years or 2 packs a day for 15 years). Yearly screening should continue until the smoker has stopped smoking for at least 15 years. Yearly screening should be stopped for people who develop a health problem that would prevent them from having lung cancer treatment.  If you are pregnant, do not drink alcohol. If you are breastfeeding,  be very cautious about drinking alcohol. If you are not pregnant and choose to drink alcohol, do not have more than 1 drink per day. One drink is considered to be 12 ounces (355 mL) of beer, 5 ounces (148 mL) of wine, or 1.5 ounces (44 mL) of liquor.  Avoid use of street drugs. Do not share needles with anyone. Ask for help if you need support or instructions about stopping the use of drugs.  High blood pressure causes heart disease and increases the risk of  stroke. Your blood pressure should be checked at least every 1 to 2 years. Ongoing high blood pressure should be treated with medicines if weight loss and exercise do not work.  If you are 75-52 years old, ask your health care provider if you should take aspirin to prevent strokes.  Diabetes screening involves taking a blood sample to check your fasting blood sugar level. This should be done once every 3 years, after age 15, if you are within normal weight and without risk factors for diabetes. Testing should be considered at a younger age or be carried out more frequently if you are overweight and have at least 1 risk factor for diabetes.  Breast cancer screening is essential preventive care for women. You should practice "breast self-awareness." This means understanding the normal appearance and feel of your breasts and may include breast self-examination. Any changes detected, no matter how small, should be reported to a health care provider. Women in their 58s and 30s should have a clinical breast exam (CBE) by a health care provider as part of a regular health exam every 1 to 3 years. After age 16, women should have a CBE every year. Starting at age 53, women should consider having a mammogram (breast X-ray test) every year. Women who have a family history of breast cancer should talk to their health care provider about genetic screening. Women at a high risk of breast cancer should talk to their health care providers about having an MRI and a mammogram every year.  Breast cancer gene (BRCA)-related cancer risk assessment is recommended for women who have family members with BRCA-related cancers. BRCA-related cancers include breast, ovarian, tubal, and peritoneal cancers. Having family members with these cancers may be associated with an increased risk for harmful changes (mutations) in the breast cancer genes BRCA1 and BRCA2. Results of the assessment will determine the need for genetic counseling and  BRCA1 and BRCA2 testing.  Routine pelvic exams to screen for cancer are no longer recommended for nonpregnant women who are considered low risk for cancer of the pelvic organs (ovaries, uterus, and vagina) and who do not have symptoms. Ask your health care provider if a screening pelvic exam is right for you.  If you have had past treatment for cervical cancer or a condition that could lead to cancer, you need Pap tests and screening for cancer for at least 20 years after your treatment. If Pap tests have been discontinued, your risk factors (such as having a new sexual partner) need to be reassessed to determine if screening should be resumed. Some women have medical problems that increase the chance of getting cervical cancer. In these cases, your health care provider may recommend more frequent screening and Pap tests.  The HPV test is an additional test that may be used for cervical cancer screening. The HPV test looks for the virus that can cause the cell changes on the cervix. The cells collected during the Pap test can be  tested for HPV. The HPV test could be used to screen women aged 30 years and older, and should be used in women of any age who have unclear Pap test results. After the age of 30, women should have HPV testing at the same frequency as a Pap test.  Colorectal cancer can be detected and often prevented. Most routine colorectal cancer screening begins at the age of 50 years and continues through age 75 years. However, your health care provider may recommend screening at an earlier age if you have risk factors for colon cancer. On a yearly basis, your health care provider may provide home test kits to check for hidden blood in the stool. Use of a small camera at the end of a tube, to directly examine the colon (sigmoidoscopy or colonoscopy), can detect the earliest forms of colorectal cancer. Talk to your health care provider about this at age 50, when routine screening begins. Direct  exam of the colon should be repeated every 5-10 years through age 75 years, unless early forms of pre-cancerous polyps or small growths are found.  People who are at an increased risk for hepatitis B should be screened for this virus. You are considered at high risk for hepatitis B if:  You were born in a country where hepatitis B occurs often. Talk with your health care provider about which countries are considered high risk.  Your parents were born in a high-risk country and you have not received a shot to protect against hepatitis B (hepatitis B vaccine).  You have HIV or AIDS.  You use needles to inject street drugs.  You live with, or have sex with, someone who has hepatitis B.  You get hemodialysis treatment.  You take certain medicines for conditions like cancer, organ transplantation, and autoimmune conditions.  Hepatitis C blood testing is recommended for all people born from 1945 through 1965 and any individual with known risks for hepatitis C.  Practice safe sex. Use condoms and avoid high-risk sexual practices to reduce the spread of sexually transmitted infections (STIs). STIs include gonorrhea, chlamydia, syphilis, trichomonas, herpes, HPV, and human immunodeficiency virus (HIV). Herpes, HIV, and HPV are viral illnesses that have no cure. They can result in disability, cancer, and death.  You should be screened for sexually transmitted illnesses (STIs) including gonorrhea and chlamydia if:  You are sexually active and are younger than 24 years.  You are older than 24 years and your health care provider tells you that you are at risk for this type of infection.  Your sexual activity has changed since you were last screened and you are at an increased risk for chlamydia or gonorrhea. Ask your health care provider if you are at risk.  If you are at risk of being infected with HIV, it is recommended that you take a prescription medicine daily to prevent HIV infection. This is  called preexposure prophylaxis (PrEP). You are considered at risk if:  You are a heterosexual woman, are sexually active, and are at increased risk for HIV infection.  You take drugs by injection.  You are sexually active with a partner who has HIV.  Talk with your health care provider about whether you are at high risk of being infected with HIV. If you choose to begin PrEP, you should first be tested for HIV. You should then be tested every 3 months for as long as you are taking PrEP.  Osteoporosis is a disease in which the bones lose minerals and strength   with aging. This can result in serious bone fractures or breaks. The risk of osteoporosis can be identified using a bone density scan. Women ages 65 years and over and women at risk for fractures or osteoporosis should discuss screening with their health care providers. Ask your health care provider whether you should take a calcium supplement or vitamin D to reduce the rate of osteoporosis.  Menopause can be associated with physical symptoms and risks. Hormone replacement therapy is available to decrease symptoms and risks. You should talk to your health care provider about whether hormone replacement therapy is right for you.  Use sunscreen. Apply sunscreen liberally and repeatedly throughout the day. You should seek shade when your shadow is shorter than you. Protect yourself by wearing long sleeves, pants, a wide-brimmed hat, and sunglasses year round, whenever you are outdoors.  Once a month, do a whole body skin exam, using a mirror to look at the skin on your back. Tell your health care provider of new moles, moles that have irregular borders, moles that are larger than a pencil eraser, or moles that have changed in shape or color.  Stay current with required vaccines (immunizations).  Influenza vaccine. All adults should be immunized every year.  Tetanus, diphtheria, and acellular pertussis (Td, Tdap) vaccine. Pregnant women should  receive 1 dose of Tdap vaccine during each pregnancy. The dose should be obtained regardless of the length of time since the last dose. Immunization is preferred during the 27th-36th week of gestation. An adult who has not previously received Tdap or who does not know her vaccine status should receive 1 dose of Tdap. This initial dose should be followed by tetanus and diphtheria toxoids (Td) booster doses every 10 years. Adults with an unknown or incomplete history of completing a 3-dose immunization series with Td-containing vaccines should begin or complete a primary immunization series including a Tdap dose. Adults should receive a Td booster every 10 years.  Varicella vaccine. An adult without evidence of immunity to varicella should receive 2 doses or a second dose if she has previously received 1 dose. Pregnant females who do not have evidence of immunity should receive the first dose after pregnancy. This first dose should be obtained before leaving the health care facility. The second dose should be obtained 4-8 weeks after the first dose.  Human papillomavirus (HPV) vaccine. Females aged 13-26 years who have not received the vaccine previously should obtain the 3-dose series. The vaccine is not recommended for use in pregnant females. However, pregnancy testing is not needed before receiving a dose. If a female is found to be pregnant after receiving a dose, no treatment is needed. In that case, the remaining doses should be delayed until after the pregnancy. Immunization is recommended for any person with an immunocompromised condition through the age of 26 years if she did not get any or all doses earlier. During the 3-dose series, the second dose should be obtained 4-8 weeks after the first dose. The third dose should be obtained 24 weeks after the first dose and 16 weeks after the second dose.  Zoster vaccine. One dose is recommended for adults aged 60 years or older unless certain conditions are  present.  Measles, mumps, and rubella (MMR) vaccine. Adults born before 1957 generally are considered immune to measles and mumps. Adults born in 1957 or later should have 1 or more doses of MMR vaccine unless there is a contraindication to the vaccine or there is laboratory evidence of immunity to   each of the three diseases. A routine second dose of MMR vaccine should be obtained at least 28 days after the first dose for students attending postsecondary schools, health care workers, or international travelers. People who received inactivated measles vaccine or an unknown type of measles vaccine during 1963-1967 should receive 2 doses of MMR vaccine. People who received inactivated mumps vaccine or an unknown type of mumps vaccine before 1979 and are at high risk for mumps infection should consider immunization with 2 doses of MMR vaccine. For females of childbearing age, rubella immunity should be determined. If there is no evidence of immunity, females who are not pregnant should be vaccinated. If there is no evidence of immunity, females who are pregnant should delay immunization until after pregnancy. Unvaccinated health care workers born before 1957 who lack laboratory evidence of measles, mumps, or rubella immunity or laboratory confirmation of disease should consider measles and mumps immunization with 2 doses of MMR vaccine or rubella immunization with 1 dose of MMR vaccine.  Pneumococcal 13-valent conjugate (PCV13) vaccine. When indicated, a person who is uncertain of her immunization history and has no record of immunization should receive the PCV13 vaccine. An adult aged 19 years or older who has certain medical conditions and has not been previously immunized should receive 1 dose of PCV13 vaccine. This PCV13 should be followed with a dose of pneumococcal polysaccharide (PPSV23) vaccine. The PPSV23 vaccine dose should be obtained at least 8 weeks after the dose of PCV13 vaccine. An adult aged 19  years or older who has certain medical conditions and previously received 1 or more doses of PPSV23 vaccine should receive 1 dose of PCV13. The PCV13 vaccine dose should be obtained 1 or more years after the last PPSV23 vaccine dose.  Pneumococcal polysaccharide (PPSV23) vaccine. When PCV13 is also indicated, PCV13 should be obtained first. All adults aged 65 years and older should be immunized. An adult younger than age 65 years who has certain medical conditions should be immunized. Any person who resides in a nursing home or long-term care facility should be immunized. An adult smoker should be immunized. People with an immunocompromised condition and certain other conditions should receive both PCV13 and PPSV23 vaccines. People with human immunodeficiency virus (HIV) infection should be immunized as soon as possible after diagnosis. Immunization during chemotherapy or radiation therapy should be avoided. Routine use of PPSV23 vaccine is not recommended for American Indians, [place] Natives, or people younger than 65 years unless there are medical conditions that require PPSV23 vaccine. When indicated, people who have unknown immunization and have no record of immunization should receive PPSV23 vaccine. One-time revaccination 5 years after the first dose of PPSV23 is recommended for people aged 19-64 years who have chronic kidney failure, nephrotic syndrome, asplenia, or immunocompromised conditions. People who received 1-2 doses of PPSV23 before age 65 years should receive another dose of PPSV23 vaccine at age 65 years or later if at least 5 years have passed since the previous dose. Doses of PPSV23 are not needed for people immunized with PPSV23 at or after age 65 years.  Meningococcal vaccine. Adults with asplenia or persistent complement component deficiencies should receive 2 doses of quadrivalent meningococcal conjugate (MenACWY-D) vaccine. The doses should be obtained at least 2 months apart.  Microbiologists working with certain meningococcal bacteria, military recruits, people at risk during an outbreak, and people who travel to or live in countries with a high rate of meningitis should be immunized. A first-year college student up through age   21 years who is living in a residence hall should receive a dose if she did not receive a dose on or after her 16th birthday. Adults who have certain high-risk conditions should receive one or more doses of vaccine.  Hepatitis A vaccine. Adults who wish to be protected from this disease, have certain high-risk conditions, work with hepatitis A-infected animals, work in hepatitis A research labs, or travel to or work in countries with a high rate of hepatitis A should be immunized. Adults who were previously unvaccinated and who anticipate close contact with an international adoptee during the first 60 days after arrival in the [place] from a country with a high rate of hepatitis A should be immunized.  Hepatitis B vaccine. Adults who wish to be protected from this disease, have certain high-risk conditions, may be exposed to blood or other infectious body fluids, are household contacts or sex partners of hepatitis B positive people, are clients or workers in certain care facilities, or travel to or work in countries with a high rate of hepatitis B should be immunized.  Haemophilus influenzae type b (Hib) vaccine. A previously unvaccinated person with asplenia or sickle cell disease or having a scheduled splenectomy should receive 1 dose of Hib vaccine. Regardless of previous immunization, a recipient of a hematopoietic stem cell transplant should receive a 3-dose series 6-12 months after her successful transplant. Hib vaccine is not recommended for adults with HIV infection. Preventive Services / Frequency Ages 64 to 68 years  Blood pressure check.** / Every 1 to 2 years.  Lipid and cholesterol check.** / Every 5 years beginning at age  22.  Clinical breast exam.** / Every 3 years for women in their 88s and 53s.  BRCA-related cancer risk assessment.** / For women who have family members with a BRCA-related cancer (breast, ovarian, tubal, or peritoneal cancers).  Pap test.** / Every 2 years from ages 90 through 51. Every 3 years starting at age 21 through age 56 or 3 with a history of 3 consecutive normal Pap tests.  HPV screening.** / Every 3 years from ages 24 through ages 1 to 46 with a history of 3 consecutive normal Pap tests.  Hepatitis C blood test.** / For any individual with known risks for hepatitis C.  Skin self-exam. / Monthly.  Influenza vaccine. / Every year.  Tetanus, diphtheria, and acellular pertussis (Tdap, Td) vaccine.** / Consult your health care provider. Pregnant women should receive 1 dose of Tdap vaccine during each pregnancy. 1 dose of Td every 10 years.  Varicella vaccine.** / Consult your health care provider. Pregnant females who do not have evidence of immunity should receive the first dose after pregnancy.  HPV vaccine. / 3 doses over 6 months, if 72 and younger. The vaccine is not recommended for use in pregnant females. However, pregnancy testing is not needed before receiving a dose.  Measles, mumps, rubella (MMR) vaccine.** / You need at least 1 dose of MMR if you were born in 1957 or later. You may also need a 2nd dose. For females of childbearing age, rubella immunity should be determined. If there is no evidence of immunity, females who are not pregnant should be vaccinated. If there is no evidence of immunity, females who are pregnant should delay immunization until after pregnancy.  Pneumococcal 13-valent conjugate (PCV13) vaccine.** / Consult your health care provider.  Pneumococcal polysaccharide (PPSV23) vaccine.** / 1 to 2 doses if you smoke cigarettes or if you have certain conditions.  Meningococcal vaccine.** /  1 dose if you are age 19 to 21 years and a first-year college  student living in a residence hall, or have one of several medical conditions, you need to get vaccinated against meningococcal disease. You may also need additional booster doses.  Hepatitis A vaccine.** / Consult your health care provider.  Hepatitis B vaccine.** / Consult your health care provider.  Haemophilus influenzae type b (Hib) vaccine.** / Consult your health care provider. Ages 40 to 64 years  Blood pressure check.** / Every 1 to 2 years.  Lipid and cholesterol check.** / Every 5 years beginning at age 20 years.  Lung cancer screening. / Every year if you are aged 55-80 years and have a 30-pack-year history of smoking and currently smoke or have quit within the past 15 years. Yearly screening is stopped once you have quit smoking for at least 15 years or develop a health problem that would prevent you from having lung cancer treatment.  Clinical breast exam.** / Every year after age 40 years.  BRCA-related cancer risk assessment.** / For women who have family members with a BRCA-related cancer (breast, ovarian, tubal, or peritoneal cancers).  Mammogram.** / Every year beginning at age 40 years and continuing for as long as you are in good health. Consult with your health care provider.  Pap test.** / Every 3 years starting at age 30 years through age 65 or 70 years with a history of 3 consecutive normal Pap tests.  HPV screening.** / Every 3 years from ages 30 years through ages 65 to 70 years with a history of 3 consecutive normal Pap tests.  Fecal occult blood test (FOBT) of stool. / Every year beginning at age 50 years and continuing until age 75 years. You may not need to do this test if you get a colonoscopy every 10 years.  Flexible sigmoidoscopy or colonoscopy.** / Every 5 years for a flexible sigmoidoscopy or every 10 years for a colonoscopy beginning at age 50 years and continuing until age 75 years.  Hepatitis C blood test.** / For all people born from 1945 through  1965 and any individual with known risks for hepatitis C.  Skin self-exam. / Monthly.  Influenza vaccine. / Every year.  Tetanus, diphtheria, and acellular pertussis (Tdap/Td) vaccine.** / Consult your health care provider. Pregnant women should receive 1 dose of Tdap vaccine during each pregnancy. 1 dose of Td every 10 years.  Varicella vaccine.** / Consult your health care provider. Pregnant females who do not have evidence of immunity should receive the first dose after pregnancy.  Zoster vaccine.** / 1 dose for adults aged 60 years or older.  Measles, mumps, rubella (MMR) vaccine.** / You need at least 1 dose of MMR if you were born in 1957 or later. You may also need a 2nd dose. For females of childbearing age, rubella immunity should be determined. If there is no evidence of immunity, females who are not pregnant should be vaccinated. If there is no evidence of immunity, females who are pregnant should delay immunization until after pregnancy.  Pneumococcal 13-valent conjugate (PCV13) vaccine.** / Consult your health care provider.  Pneumococcal polysaccharide (PPSV23) vaccine.** / 1 to 2 doses if you smoke cigarettes or if you have certain conditions.  Meningococcal vaccine.** / Consult your health care provider.  Hepatitis A vaccine.** / Consult your health care provider.  Hepatitis B vaccine.** / Consult your health care provider.  Haemophilus influenzae type b (Hib) vaccine.** / Consult your health care provider. Ages 65   years and over  Blood pressure check.** / Every 1 to 2 years.  Lipid and cholesterol check.** / Every 5 years beginning at age 22 years.  Lung cancer screening. / Every year if you are aged 73-80 years and have a 30-pack-year history of smoking and currently smoke or have quit within the past 15 years. Yearly screening is stopped once you have quit smoking for at least 15 years or develop a health problem that would prevent you from having lung cancer  treatment.  Clinical breast exam.** / Every year after age 4 years.  BRCA-related cancer risk assessment.** / For women who have family members with a BRCA-related cancer (breast, ovarian, tubal, or peritoneal cancers).  Mammogram.** / Every year beginning at age 40 years and continuing for as long as you are in good health. Consult with your health care provider.  Pap test.** / Every 3 years starting at age 9 years through age 34 or 91 years with 3 consecutive normal Pap tests. Testing can be stopped between 65 and 70 years with 3 consecutive normal Pap tests and no abnormal Pap or HPV tests in the past 10 years.  HPV screening.** / Every 3 years from ages 57 years through ages 64 or 45 years with a history of 3 consecutive normal Pap tests. Testing can be stopped between 65 and 70 years with 3 consecutive normal Pap tests and no abnormal Pap or HPV tests in the past 10 years.  Fecal occult blood test (FOBT) of stool. / Every year beginning at age 15 years and continuing until age 17 years. You may not need to do this test if you get a colonoscopy every 10 years.  Flexible sigmoidoscopy or colonoscopy.** / Every 5 years for a flexible sigmoidoscopy or every 10 years for a colonoscopy beginning at age 86 years and continuing until age 71 years.  Hepatitis C blood test.** / For all people born from [birth year] through 1967 and any individual with known risks for hepatitis C.  Osteoporosis screening.** / A one-time screening for women ages 83 years and over and women at risk for fractures or osteoporosis.  Skin self-exam. / Monthly.  Influenza vaccine. / Every year.  Tetanus, diphtheria, and acellular pertussis (Tdap/Td) vaccine.** / 1 dose of Td every 10 years.  Varicella vaccine.** / Consult your health care provider.  Zoster vaccine.** / 1 dose for adults aged 61 years or older.  Pneumococcal 13-valent conjugate (PCV13) vaccine.** / Consult your health care provider.  Pneumococcal  polysaccharide (PPSV23) vaccine.** / 1 dose for all adults aged 28 years and older.  Meningococcal vaccine.** / Consult your health care provider.  Hepatitis A vaccine.** / Consult your health care provider.  Hepatitis B vaccine.** / Consult your health care provider.  Haemophilus influenzae type b (Hib) vaccine.** / Consult your health care provider. ** Family history and personal history of risk and conditions may change your health care provider's recommendations. Document Released: 06/12/2001 Document Revised: 08/31/2013 Document Reviewed: 09/11/2010 [facility] Patient Information 2015 [place], [place]. This information is not intended to replace advice given to you by your health care provider. Make sure you discuss any questions you have with your health care provider.

## 2013-12-16 NOTE — Progress Notes (Signed)
Subjective:     Gail Mccann is a 53 y.o. female and is here for a comprehensive physical exam. The patient reports problems - still has cough--see last ov.  History   Social History  . Marital Status: Single    Spouse Name: N/A    Number of Children: N/A  . Years of Education: N/A   Occupational History  . [employer]    Social History Main Topics  . Smoking status: Never Smoker   . Smokeless tobacco: Never Used  . Alcohol Use: No  . Drug Use: No  . Sexual Activity: Not on file   Other Topics Concern  . Not on file   Social History Narrative   Exercise-- walk   Health Maintenance  Topic Date Due  . Colonoscopy  03/12/2009  . Influenza Vaccine  02/15/2014 (Originally 11/28/2013)  . Mammogram  06/17/2014  . Pap Smear  05/28/2015  . Tetanus/tdap  12/16/2017    The following portions of the patient's history were reviewed and updated as appropriate:  She  has a past medical history of Thyroid disease. She  does not have any pertinent problems on file. She  has past surgical history that includes Tubal ligation (1986); Cesarean section; Abdominal hysterectomy (2012); Tonsillectomy (1968); and Nasal sinus surgery (1986). Her family history includes Breast cancer in her maternal aunt; Diabetes in her maternal grandmother; Kidney disease in her son. She  reports that she has never smoked. She has never used smokeless tobacco. She reports that she does not drink alcohol or use illicit drugs. She has a current medication list which includes the following prescription(s): calcium carb-cholecalciferol, levothyroxine, liothyronine, melatonin, and multivitamin. Current Outpatient Prescriptions on File Prior to Visit  Medication Sig Dispense Refill  . Calcium Carbonate-Vitamin D (CALCIUM 600 + D PO) Take by mouth.      . levothyroxine (SYNTHROID, LEVOTHROID) 75 MCG tablet TAKE ONE TABLET BY MOUTH ONCE DAILY  30 tablet  11  . liothyronine (CYTOMEL) 25 MCG tablet Take 1 tablet (25 mcg  total) by mouth daily.  90 tablet  3  . Melatonin 5 MG TABS Take by mouth.      . Multiple Vitamin (MULTIVITAMIN) tablet Take 1 tablet by mouth daily.       No current facility-administered medications on file prior to visit.   She has No Known Allergies..  Review of Systems Review of Systems  Constitutional: Negative for activity change, appetite change and fatigue.  HENT: Negative for hearing loss, congestion, tinnitus and ear discharge.  dentist q21m Eyes: Negative for visual disturbance (see optho q1y -- vision corrected to 20/20 with glasses).  Respiratory: positive for cough, chest tightness and shortness of breath.   Cardiovascular: Negative for chest pain, palpitations and leg swelling.  Gastrointestinal: Negative for abdominal pain, diarrhea, constipation and abdominal distention.  Genitourinary: Negative for urgency, frequency, decreased urine volume and difficulty urinating.  Musculoskeletal: Negative for back pain, arthralgias and gait problem.  Skin: Negative for color change, pallor and rash.  Neurological: Negative for dizziness, light-headedness, numbness and headaches.  Hematological: Negative for adenopathy. Does not bruise/bleed easily.  Psychiatric/Behavioral: Negative for suicidal ideas, confusion, sleep disturbance, self-injury, dysphoric mood, decreased concentration and agitation.       Objective:    BP 118/76  Pulse 77  Temp(Src) 97.9 F (36.6 C) (Oral)  Ht 5' 4.25" (1.632 m)  Wt 216 lb (97.977 kg)  BMI 36.79 kg/m2  SpO2 98% General appearance: alert, cooperative, appears stated age and no distress Head: Normocephalic,  without obvious abnormality, atraumatic Eyes: conjunctivae/corneas clear. PERRL, EOM&#39;s intact. Fundi benign. Ears: normal TM&#39;s and external ear canals both ears Nose: Nares normal. Septum midline. Mucosa normal. No drainage or sinus tenderness. Throat: lips, mucosa, and tongue normal; teeth and gums normal Neck: no adenopathy,  no carotid bruit, no JVD, supple, symmetrical, trachea midline and thyroid not enlarged, symmetric, no tenderness/mass/nodules Back: symmetric, no curvature. ROM normal. No CVA tenderness. Lungs: clear to auscultation bilaterally Breasts: normal appearance, no masses or tenderness Heart: regular rate and rhythm, S1, S2 normal, no murmur, click, rub or gallop Abdomen: soft, non-tender; bowel sounds normal; no masses,  no organomegaly Pelvic: deferred Extremities: extremities normal, atraumatic, no cyanosis or edema Pulses: 2+ and symmetric Skin: Skin color, texture, turgor normal. No rashes or lesions Lymph nodes: Cervical, supraclavicular, and axillary nodes normal. Neurologic: Alert and oriented X 3, normal strength and tone. Normal symmetric reflexes. Normal coordination and gait Psych-- no c      Assessment:    Healthy female exam.      Plan:     See After Visit Summary for Counseling Recommendations  1. Other screening breast examination   - MM DIGITAL SCREENING BILATERAL; Future  2. Preventative health care ghm utd Check labs - Basic metabolic panel - CBC with Differential - Hepatic function panel - Lipid panel - POCT urinalysis dipstick  3. Cough Take prilosec and antihistamine otc Check cxr - DG Chest 2 View; Future

## 2013-12-18 ENCOUNTER — Ambulatory Visit (HOSPITAL_BASED_OUTPATIENT_CLINIC_OR_DEPARTMENT_OTHER): Payer: No Typology Code available for payment source

## 2013-12-21 ENCOUNTER — Ambulatory Visit (HOSPITAL_BASED_OUTPATIENT_CLINIC_OR_DEPARTMENT_OTHER): Payer: No Typology Code available for payment source

## 2013-12-21 ENCOUNTER — Other Ambulatory Visit (HOSPITAL_BASED_OUTPATIENT_CLINIC_OR_DEPARTMENT_OTHER): Payer: No Typology Code available for payment source

## 2014-01-02 ENCOUNTER — Emergency Department (INDEPENDENT_AMBULATORY_CARE_PROVIDER_SITE_OTHER)
Admission: EM | Admit: 2014-01-02 | Discharge: 2014-01-02 | Disposition: A | Discharge status: Home / Self Care | Payer: No Typology Code available for payment source | Source: Home / Self Care | Attending: Emergency Medicine | Admitting: Emergency Medicine

## 2014-01-02 ENCOUNTER — Encounter: Payer: Self-pay | Admitting: Emergency Medicine

## 2014-01-02 ENCOUNTER — Emergency Department (INDEPENDENT_AMBULATORY_CARE_PROVIDER_SITE_OTHER): Payer: No Typology Code available for payment source

## 2014-01-02 DIAGNOSIS — R059 Cough, unspecified: Secondary | ICD-10-CM

## 2014-01-02 DIAGNOSIS — R05 Cough: Secondary | ICD-10-CM

## 2014-01-02 DIAGNOSIS — J208 Acute bronchitis due to other specified organisms: Secondary | ICD-10-CM

## 2014-01-02 DIAGNOSIS — J209 Acute bronchitis, unspecified: Secondary | ICD-10-CM

## 2014-01-02 MED ORDER — PREDNISONE 10 MG PO TABS: ORAL_TABLET | ORAL | Status: DC

## 2014-01-02 MED ORDER — IPRATROPIUM-ALBUTEROL 0.5-2.5 (3) MG/3ML IN SOLN
3.0000 mL | RESPIRATORY_TRACT | Status: DC
  Administered 2014-01-02: 3 mL via RESPIRATORY_TRACT

## 2014-01-02 MED ORDER — AZITHROMYCIN 250 MG PO TABS: ORAL_TABLET | ORAL | Status: DC

## 2014-01-02 MED ORDER — ALBUTEROL SULFATE HFA 108 (90 BASE) MCG/ACT IN AERS: 1.0000 | INHALATION_SPRAY | Freq: Four times a day (QID) | RESPIRATORY_TRACT | Status: DC | PRN

## 2014-01-02 NOTE — Discharge Instructions (Signed)

## 2014-01-02 NOTE — ED Notes (Signed)
Early sx began a couple of weeks ago.  Cough began this week.  Afebrile.  Taking Alka Seltzer for cough, not working.  In the beginning was hoarse

## 2014-01-02 NOTE — ED Provider Notes (Signed)
CSN: [id number]     Arrival date & time 01/02/14  1432 History   First MD Initiated Contact with Patient 01/02/14 1459     Chief Complaint  Patient presents with  . Cough   (Consider location/radiation/quality/duration/timing/severity/associated sxs/prior Treatment) Patient is a 53 y.o. female presenting with cough. The history is provided by the patient. No language interpreter was used.  Cough Cough characteristics:  Non-productive and hoarse Severity:  Severe Onset quality:  Gradual Duration:  3 weeks Timing:  Constant Progression:  Worsening Chronicity:  New Smoker: no   Context: not sick contacts   Relieved by:  Nothing Worsened by:  Nothing tried Ineffective treatments:  Decongestant Associated symptoms: fever and rhinorrhea   Associated symptoms: no chest pain     Past Medical History  Diagnosis Date  . Thyroid disease    Past Surgical History  Procedure Laterality Date  . Tubal ligation  1986  . Cesarean section      x's 2  . Abdominal hysterectomy  2012  . Tonsillectomy  1968  . Nasal sinus surgery  1986   Family History  Problem Relation Age of Onset  . Breast cancer Maternal Aunt   . Diabetes Maternal Grandmother   . Kidney disease Son    History  Substance Use Topics  . Smoking status: Never Smoker   . Smokeless tobacco: Never Used  . Alcohol Use: No   OB History   Grav Para Term Preterm Abortions TAB SAB Ect Mult Living                 Review of Systems  Constitutional: Positive for fever.  HENT: Positive for rhinorrhea.   Respiratory: Positive for cough.   Cardiovascular: Negative for chest pain.  All other systems reviewed and are negative. Pt reports coughing for 3 weeks.  Getting worse  Allergies  Review of patient's allergies indicates no known allergies.  Home Medications   Prior to Admission medications   Medication Sig Start Date End Date Taking? Authorizing Provider  albuterol (PROVENTIL HFA;VENTOLIN HFA) 108 (90 BASE)  MCG/ACT inhaler Inhale 1-2 puffs into the lungs every 6 (six) hours as needed for wheezing or shortness of breath. 01/02/14   Elson Areas, PA-C  azithromycin (ZITHROMAX Z-PAK) 250 MG tablet Two tablets 1st day then 1 a day 01/02/14   Elson Areas, PA-C  Calcium Carbonate-Vitamin D (CALCIUM 600 + D PO) Take by mouth.    Historical Provider, MD  levothyroxine (SYNTHROID, LEVOTHROID) 75 MCG tablet TAKE ONE TABLET BY MOUTH ONCE DAILY    Grayling Congress Lowne, DO  liothyronine (CYTOMEL) 25 MCG tablet Take 1 tablet (25 mcg total) by mouth daily. 08/06/13   Lelon Perla, DO  Melatonin 5 MG TABS Take by mouth.    Historical Provider, MD  Multiple Vitamin (MULTIVITAMIN) tablet Take 1 tablet by mouth daily.    Historical Provider, MD  predniSONE (DELTASONE) 10 MG tablet 6,5,4,3,2,1 taper 01/02/14   Elson Areas, PA-C   BP 127/84  Pulse 81  Temp(Src) 98.4 F (36.9 C) (Oral)  Ht  (1.676 m)  Wt 218 lb 12 oz (99.224 kg)  BMI 35.32 kg/m2  SpO2 96% Physical Exam  Nursing note and vitals reviewed. Constitutional: She is oriented to person, place, and time. She appears well-developed and well-nourished.  HENT:  Head: Normocephalic.  Eyes: Conjunctivae and EOM are normal. Pupils are equal, round, and reactive to light.  Neck: Normal range of motion.  Cardiovascular: Normal rate and normal  heart sounds.   Pulmonary/Chest: Effort normal. She has wheezes.  Abdominal: She exhibits no distension.  Musculoskeletal: Normal range of motion.  Neurological: She is alert and oriented to person, place, and time.  Skin: Skin is warm.  Psychiatric: She has a normal mood and affect.    ED Course  Procedures (including critical care time) Labs Review Labs Reviewed - No data to display  Imaging Review Dg Chest 2 View  01/02/2014   CLINICAL DATA:  Cough for 1 week.  EXAM: CHEST  2 VIEW  COMPARISON:  07/07/2004  FINDINGS: The cardiomediastinal silhouette is within normal limits. The lungs are well inflated and  clear. There is no evidence of pleural effusion or pneumothorax. No acute osseous abnormality is identified.  IMPRESSION: No active cardiopulmonary disease.   Electronically Signed   By: Sebastian Ache   On: 01/02/2014 15:29     MDM   1. Acute bronchitis due to other specified organisms    Albuterol inhaler zithromax Prednisone See Dr. Laury Axon for recheck if not improving in 2-3 days    Elson Areas, PA-C 01/02/14 1736

## 2014-01-07 NOTE — ED Provider Notes (Signed)
Medical history/examination/treatment/procedure(s) were performed by non-physician provider and as supervising physician I was immediately available for consultation/collaboration.   Lajean Manes, MD 01/07/14 1020

## 2014-07-20 ENCOUNTER — Emergency Department (INDEPENDENT_AMBULATORY_CARE_PROVIDER_SITE_OTHER)
Admission: EM | Admit: 2014-07-20 | Discharge: 2014-07-20 | Disposition: A | Discharge status: Home / Self Care | Payer: No Typology Code available for payment source | Source: Home / Self Care | Attending: Emergency Medicine | Admitting: Emergency Medicine

## 2014-07-20 ENCOUNTER — Encounter: Payer: Self-pay | Admitting: *Deleted

## 2014-07-20 DIAGNOSIS — J209 Acute bronchitis, unspecified: Secondary | ICD-10-CM

## 2014-07-20 MED ORDER — ALBUTEROL SULFATE HFA 108 (90 BASE) MCG/ACT IN AERS: 2.0000 | INHALATION_SPRAY | RESPIRATORY_TRACT | Status: DC | PRN

## 2014-07-20 MED ORDER — AZITHROMYCIN 250 MG PO TABS: ORAL_TABLET | ORAL | Status: DC

## 2014-07-20 MED ORDER — PREDNISONE 50 MG PO TABS: 50.0000 mg | ORAL_TABLET | Freq: Every day | ORAL | Status: DC

## 2014-07-20 NOTE — ED Notes (Signed)
Pt c/o nonproductive cough and chest hurts x 1 day. Denies fever. Reports hx of bronchitis. She took Mucinex DM today.

## 2014-07-20 NOTE — ED Provider Notes (Signed)
CSN: [id number]     Arrival date & time 07/20/14  1547 History   First MD Initiated Contact with Patient 07/20/14 1547     Chief Complaint  Patient presents with  . Cough    HPI  Raeana is a 54 y.o. female who complains chest congestion and bronchitis symptoms for 2 days.   Have been using albuterol inhaler which she has at home, which helps a little bit. She feels she is "catching this early" but she usually needs an antibiotic and a short course of oral prednisone burst. She tends to get bronchitis every spring and fall.  No chills/sweats No definite  Fever, but feels warm at times.  Minimal nasal congestion, occasional sneezing No Discolored Post-nasal drainage No sinus pain/pressure No sore throat  +  cough, nonproductive Positive wheezing Positive chest congestion No hemoptysis Occasional shortness of breath with wheezing and exertion. No exertional chest pain No pleuritic pain She feels a burning irritation in her chest when she coughs  No itchy/red eyes No earache  No nausea No vomiting No abdominal pain No diarrhea  No skin rashes +  Fatigue No myalgias No headache   Past Medical History  Diagnosis Date  . Thyroid disease    Past Surgical History  Procedure Laterality Date  . Tubal ligation  1986  . Cesarean section      x's 2  . Abdominal hysterectomy  2012  . Tonsillectomy  1968  . Nasal sinus surgery  1986  . Tonsillectomy     Family History  Problem Relation Age of Onset  . Breast cancer Maternal Aunt   . Diabetes Maternal Grandmother   . Kidney disease Son    History  Substance Use Topics  . Smoking status: Never Smoker   . Smokeless tobacco: Never Used  . Alcohol Use: No   OB History    No data available     Review of Systems  All other systems reviewed and are negative.   Allergies  Review of patient's allergies indicates no known allergies.  Home Medications   Prior to Admission medications   Medication Sig Start  Date End Date Taking? Authorizing Provider  albuterol (PROVENTIL HFA;VENTOLIN HFA) 108 (90 BASE) MCG/ACT inhaler Inhale 2 puffs into the lungs every 4 (four) hours as needed for wheezing or shortness of breath. 07/20/14   Lajean Manes, MD  azithromycin (ZITHROMAX Z-PAK) 250 MG tablet Take 2 tablets on day one, then 1 tablet daily on days 2 through 5 07/20/14   Lajean Manes, MD  Calcium Carbonate-Vitamin D (CALCIUM 600 + D PO) Take by mouth.    Historical Provider, MD  levothyroxine (SYNTHROID, LEVOTHROID) 75 MCG tablet TAKE ONE TABLET BY MOUTH ONCE DAILY    Grayling Congress Lowne, DO  liothyronine (CYTOMEL) 25 MCG tablet Take 1 tablet (25 mcg total) by mouth daily. 08/06/13   Lelon Perla, DO  Melatonin 5 MG TABS Take by mouth.    Historical Provider, MD  Multiple Vitamin (MULTIVITAMIN) tablet Take 1 tablet by mouth daily.    Historical Provider, MD  predniSONE (DELTASONE) 50 MG tablet Take 1 tablet (50 mg total) by mouth daily. With food for 5 days. 07/20/14   Lajean Manes, MD   BP 113/84 mmHg  Pulse 81  Temp(Src) 98.2 F (36.8 C) (Oral)  Resp 16  Ht 5' 5.5" (1.664 m)  Wt 233 lb (105.688 kg)  BMI 38.17 kg/m2  SpO2 98% Physical Exam  Constitutional: She is oriented to person, place, and  time. She appears well-developed and well-nourished. No distress.  HENT:  Head: Normocephalic and atraumatic.  Right Ear: Tympanic membrane normal.  Left Ear: Tympanic membrane normal.  Nose: Nose normal.  Mouth/Throat: Oropharynx is clear and moist. No oropharyngeal exudate.  Eyes: Right eye exhibits no discharge. Left eye exhibits no discharge. No scleral icterus.  Neck: Neck supple.  Cardiovascular: Normal rate, regular rhythm and normal heart sounds.   Pulmonary/Chest: No respiratory distress. She has wheezes (mild late expiratory. Good air movement bilaterally). She has rhonchi. She has no rales.  Lymphadenopathy:    She has no cervical adenopathy.  Neurological: She is alert and oriented to person, place,  and time.  Skin: Skin is warm and dry. No rash noted.  Nursing note and vitals reviewed.   ED Course  Procedures (including critical care time) Labs Review Labs Reviewed - No data to display  Imaging Review No results found.   MDM   1. Acute bronchitis with bronchospasm    Treatment options discussed, as well as risks, benefits, alternatives. Patient voiced understanding and agreement with the following plans: Z-Pak Prednisone 50 mg daily 5 days Other symptomatic care discussed Use albuterol HFA as needed. Printed refill for her to keep on hand at her request She declined any other treatment or testing. Follow-up with your primary care doctor in 5-7 days if not improving, or sooner if symptoms become worse. Precautions discussed. Red flags discussed. Questions invited and answered. Patient voiced understanding and agreement.     Lajean Manesavid Massey, MD 07/20/14 [phone]

## 2014-08-25 ENCOUNTER — Other Ambulatory Visit: Payer: Self-pay

## 2014-08-25 DIAGNOSIS — E039 Hypothyroidism, unspecified: Secondary | ICD-10-CM

## 2014-08-25 MED ORDER — LIOTHYRONINE SODIUM 25 MCG PO TABS: 25.0000 ug | ORAL_TABLET | Freq: Every day | ORAL | Status: DC

## 2014-09-06 ENCOUNTER — Ambulatory Visit: Payer: No Typology Code available for payment source | Admitting: Family Medicine

## 2014-09-13 ENCOUNTER — Encounter: Payer: Self-pay | Admitting: Family Medicine

## 2014-09-13 ENCOUNTER — Ambulatory Visit (INDEPENDENT_AMBULATORY_CARE_PROVIDER_SITE_OTHER): Payer: No Typology Code available for payment source | Admitting: Family Medicine

## 2014-09-13 ENCOUNTER — Ambulatory Visit: Payer: No Typology Code available for payment source | Admitting: Family Medicine

## 2014-09-13 VITALS — BP 116/74 | HR 65 | Temp 98.0°F | Wt 229.0 lb

## 2014-09-13 DIAGNOSIS — M2550 Pain in unspecified joint: Secondary | ICD-10-CM

## 2014-09-13 DIAGNOSIS — E038 Other specified hypothyroidism: Secondary | ICD-10-CM

## 2014-09-13 LAB — THYROID PANEL WITH TSH
Free Thyroxine Index: 2.1 (ref 1.4–3.8)
T3 Uptake: 27 % (ref 22–35)
T4, Total: 7.6 ug/dL (ref 4.5–12.0)
TSH: 0.046 u[IU]/mL — ABNORMAL LOW (ref 0.350–4.500)

## 2014-09-13 LAB — BASIC METABOLIC PANEL
BUN: 11 mg/dL (ref 6–23)
CO2: 29 meq/L (ref 19–32)
Calcium: 9.8 mg/dL (ref 8.4–10.5)
Chloride: 103 mEq/L (ref 96–112)
Creatinine, Ser: 0.62 mg/dL (ref 0.40–1.20)
GFR: 106.02 mL/min (ref >60.00)
GLUCOSE: 91 mg/dL (ref 70–99)
Potassium: 3.8 mEq/L (ref 3.5–5.1)
SODIUM: 137 meq/L (ref 135–145)

## 2014-09-13 LAB — CBC WITH DIFFERENTIAL/PLATELET
Basophils Absolute: 0 10*3/uL (ref 0.0–0.1)
Basophils Relative: 0.6 % (ref 0.0–3.0)
EOS ABS: 0.3 10*3/uL (ref 0.0–0.7)
Eosinophils Relative: 5.2 % — ABNORMAL HIGH (ref 0.0–5.0)
HCT: 40.9 % (ref 36.0–46.0)
Hemoglobin: 13.8 g/dL (ref 12.0–15.0)
LYMPHS ABS: 1.9 10*3/uL (ref 0.7–4.0)
LYMPHS PCT: 29.1 % (ref 12.0–46.0)
MCHC: 33.6 g/dL (ref 30.0–36.0)
MCV: 79.8 fl (ref 78.0–100.0)
Monocytes Absolute: 0.4 10*3/uL (ref 0.1–1.0)
Monocytes Relative: 6.6 % (ref 3.0–12.0)
NEUTROS ABS: 3.9 10*3/uL (ref 1.4–7.7)
NEUTROS PCT: 58.5 % (ref 43.0–77.0)
PLATELETS: 291 10*3/uL (ref 150.0–400.0)
RBC: 5.13 Mil/uL — ABNORMAL HIGH (ref 3.87–5.11)
RDW: 14.8 % (ref 11.5–15.5)
WBC: 6.6 10*3/uL (ref 4.0–10.5)

## 2014-09-13 LAB — HEPATIC FUNCTION PANEL
ALBUMIN: 4.2 g/dL (ref 3.5–5.2)
ALT: 46 U/L — ABNORMAL HIGH (ref 0–35)
AST: 34 U/L (ref 0–37)
Alkaline Phosphatase: 158 U/L — ABNORMAL HIGH (ref 39–117)
Bilirubin, Direct: 0.1 mg/dL (ref 0.0–0.3)
TOTAL PROTEIN: 7.7 g/dL (ref 6.0–8.3)
Total Bilirubin: 0.5 mg/dL (ref 0.2–1.2)

## 2014-09-13 LAB — VITAMIN B12: Vitamin B-12: 777 pg/mL (ref 211–911)

## 2014-09-13 LAB — RHEUMATOID FACTOR: Rheumatoid fact SerPl-aCnc: <10 [IU]/mL

## 2014-09-13 NOTE — Patient Instructions (Addendum)
Hypothyroidism The thyroid is a large gland located in the lower front of your neck. The thyroid gland helps control metabolism. Metabolism is how your body handles food. It controls metabolism with the hormone thyroxine. When this gland is underactive (hypothyroid), it produces too little hormone.  CAUSES These include:   Absence or destruction of thyroid tissue.  Goiter due to iodine deficiency.  Goiter due to medications.  Congenital defects (since birth).  Problems with the pituitary. This causes a lack of TSH (thyroid stimulating hormone). This hormone tells the thyroid to turn out more hormone. SYMPTOMS  Lethargy (feeling as though you have no energy)  Cold intolerance  Weight gain (in spite of normal food intake)  Dry skin  Coarse hair  Menstrual irregularity (if severe, may lead to infertility)  Slowing of thought processes Cardiac problems are also caused by insufficient amounts of thyroid hormone. Hypothyroidism in the newborn is cretinism, and is an extreme form. It is important that this form be treated adequately and immediately or it will lead rapidly to retarded physical and mental development. DIAGNOSIS  To prove hypothyroidism, your caregiver may do blood tests and ultrasound tests. Sometimes the signs are hidden. It may be necessary for your caregiver to watch this illness with blood tests either before or after diagnosis and treatment. TREATMENT  Low levels of thyroid hormone are increased by using synthetic thyroid hormone. This is a safe, effective treatment. It usually takes about four weeks to gain the full effects of the medication. After you have the full effect of the medication, it will generally take another four weeks for problems to leave. Your caregiver may start you on low doses. If you have had heart problems the dose may be gradually increased. It is generally not an emergency to get rapidly to normal. HOME CARE INSTRUCTIONS   Take your  medications as your caregiver suggests. Let your caregiver know of any medications you are taking or start taking. Your caregiver will help you with dosage schedules.  As your condition improves, your dosage needs may increase. It will be necessary to have continuing blood tests as suggested by your caregiver.  Report all suspected medication side effects to your caregiver. SEEK MEDICAL CARE IF: Seek medical care if you develop:  Sweating.  Tremulousness (tremors).  Anxiety.  Rapid weight loss.  Heat intolerance.  Emotional swings.  Diarrhea.  Weakness. SEEK IMMEDIATE MEDICAL CARE IF:  You develop chest pain, an irregular heart beat (palpitations), or a rapid heart beat. MAKE SURE YOU:   Understand these instructions.  Will watch your condition.  Will get help right away if you are not doing well or get worse. Document Released: 04/16/2005 Document Revised: 07/09/2011 Document Reviewed: 12/05/2007 [employer] Patient Information 2015 [employer]. This information is not intended to replace advice given to you by your health care provider. Make sure you discuss any questions you have with your health care provider.  

## 2014-09-13 NOTE — Progress Notes (Signed)
Pre visit review using our clinic review tool, if applicable. No additional management support is needed unless otherwise documented below in the visit note. 

## 2014-09-13 NOTE — Progress Notes (Signed)
Patient ID: Gail Mccann, female    DOB: 1960/10/01  Age: 54 y.o. MRN: [id number]    Subjective:  Subjective HPI Gail Mccann presents for f/u thyroid and is very concerned about her weight gain.  She is also c/o joint pains all over.  She states it tends to occur when her thyroid meds need to be adjusted.    Review of Systems  Constitutional: Negative for activity change, appetite change, fatigue and unexpected weight change.  Respiratory: Negative for cough and shortness of breath.   Cardiovascular: Negative for chest pain and palpitations.  Musculoskeletal: Negative for arthralgias.  Psychiatric/Behavioral: Negative for behavioral problems and dysphoric mood. The patient is not nervous/anxious.     History Past Medical History  Diagnosis Date  . Thyroid disease     She has past surgical history that includes Tubal ligation (1986); Cesarean section; Abdominal hysterectomy (2012); Tonsillectomy (1968); Nasal sinus surgery (1986); and Tonsillectomy.   Her family history includes Breast cancer in her maternal aunt; Diabetes in her maternal grandmother; Kidney disease in her son.She reports that she has never smoked. She has never used smokeless tobacco. She reports that she does not drink alcohol or use illicit drugs.  Current Outpatient Prescriptions on File Prior to Visit  Medication Sig Dispense Refill  . Calcium Carbonate-Vitamin D (CALCIUM 600 + D PO) Take by mouth.    . levothyroxine (SYNTHROID, LEVOTHROID) 75 MCG tablet TAKE ONE TABLET BY MOUTH ONCE DAILY 30 tablet 11  . liothyronine (CYTOMEL) 25 MCG tablet Take 1 tablet (25 mcg total) by mouth daily. 90 tablet 0  . Melatonin 5 MG TABS Take by mouth.    . Multiple Vitamin (MULTIVITAMIN) tablet Take 1 tablet by mouth daily.     No current facility-administered medications on file prior to visit.     Objective:  Objective Physical Exam  Constitutional: She is oriented to person, place, and time. She appears  well-developed and well-nourished.  HENT:  Head: Normocephalic and atraumatic.  Mouth/Throat: No oropharyngeal exudate.  Eyes: Conjunctivae and EOM are normal.  Neck: Normal range of motion. Neck supple. No JVD present. Carotid bruit is not present. No thyromegaly present.  Cardiovascular: Normal rate, regular rhythm and normal heart sounds.   No murmur heard. Pulmonary/Chest: Effort normal and breath sounds normal. No respiratory distress. She has no wheezes. She has no rales. She exhibits no tenderness.  Musculoskeletal: She exhibits no edema.  Neurological: She is alert and oriented to person, place, and time.  Psychiatric: She has a normal mood and affect. Her behavior is normal. Judgment and thought content normal.   BP 116/74 mmHg  Pulse 65  Temp(Src) 98 F (36.7 C) (Oral)  Wt 229 lb (103.874 kg)  SpO2 99% Wt Readings from Last 3 Encounters:  09/13/14 229 lb (103.874 kg)  07/20/14 233 lb (105.688 kg)  01/02/14 218 lb 12 oz (99.224 kg)     Lab Results  Component Value Date   WBC 6.6 09/13/2014   HGB 13.8 09/13/2014   HCT 40.9 09/13/2014   PLT 291.0 09/13/2014   GLUCOSE 91 09/13/2014   CHOL 171 12/16/2013   TRIG 45.0 12/16/2013   HDL 69.80 12/16/2013   LDLCALC 92 12/16/2013   ALT 46* 09/13/2014   AST 34 09/13/2014   NA 137 09/13/2014   K 3.8 09/13/2014   CL 103 09/13/2014   CREATININE 0.62 09/13/2014   BUN 11 09/13/2014   CO2 29 09/13/2014   TSH 0.046* 09/13/2014    No results found.  Assessment & Plan:  Plan I have discontinued Ms. Pollman's albuterol, azithromycin, and predniSONE. I am also having her maintain her Melatonin, Calcium Carbonate-Vitamin D (CALCIUM 600 + D PO), multivitamin, levothyroxine, liothyronine, B Complex-Biotin-FA (B-COMPLEX PO), Fish Oil, and Prenatal Vit-Fe Fumarate-FA (PRENATAL VITAMIN PO).  Meds ordered this encounter  Medications  . B Complex-Biotin-FA (B-COMPLEX PO)    Sig: Take by mouth.  . Omega-3 Fatty Acids (FISH OIL) 1000  MG CAPS    Sig: Take by mouth.  . Prenatal Vit-Fe Fumarate-FA (PRENATAL VITAMIN PO)    Sig: Take by mouth.    Problem List Items Addressed This Visit    None    Visit Diagnoses    Other specified hypothyroidism    -  Primary    Relevant Orders    Thyroid Panel With TSH (Completed)    ANA (Completed)    Rheumatoid factor (Completed)    Vitamin B12 (Completed)    Vitamin D 1,25 dihydroxy (Completed)    Basic metabolic panel (Completed)    CBC with Differential/Platelet (Completed)    Hepatic function panel (Completed)    Joint pain        Relevant Orders    Thyroid Panel With TSH (Completed)    ANA (Completed)    Rheumatoid factor (Completed)    Vitamin B12 (Completed)    Vitamin D 1,25 dihydroxy (Completed)    Basic metabolic panel (Completed)    CBC with Differential/Platelet (Completed)    Hepatic function panel (Completed)       Follow-up: Return in about 6 months (around 03/16/2015), or if symptoms worsen or fail to improve.  Loreen FreudYvonne Lowne, DO

## 2014-09-14 LAB — ANA: Anti Nuclear Antibody(ANA): NEGATIVE

## 2014-09-17 ENCOUNTER — Other Ambulatory Visit: Payer: Self-pay

## 2014-09-17 ENCOUNTER — Encounter: Payer: Self-pay | Admitting: Family Medicine

## 2014-09-17 LAB — VITAMIN D 1,25 DIHYDROXY
VITAMIN D3 1, 25 (OH): 66 pg/mL
Vitamin D 1, 25 (OH)2 Total: 66 pg/mL (ref 18–72)

## 2014-09-17 MED ORDER — LEVOTHYROXINE SODIUM 50 MCG PO TABS: 50.0000 ug | ORAL_TABLET | Freq: Every day | ORAL | Status: DC

## 2014-09-17 NOTE — Telephone Encounter (Signed)
Ok to send rx

## 2014-11-03 ENCOUNTER — Ambulatory Visit (INDEPENDENT_AMBULATORY_CARE_PROVIDER_SITE_OTHER): Payer: No Typology Code available for payment source | Admitting: Physician Assistant

## 2014-11-03 ENCOUNTER — Encounter: Payer: Self-pay | Admitting: Physician Assistant

## 2014-11-03 ENCOUNTER — Telehealth: Payer: Self-pay | Admitting: Family Medicine

## 2014-11-03 ENCOUNTER — Ambulatory Visit (HOSPITAL_BASED_OUTPATIENT_CLINIC_OR_DEPARTMENT_OTHER)
Admission: RE | Admit: 2014-11-03 | Discharge: 2014-11-03 | Disposition: A | Discharge status: Home / Self Care | Payer: No Typology Code available for payment source | Source: Ambulatory Visit | Attending: Physician Assistant | Admitting: Physician Assistant

## 2014-11-03 ENCOUNTER — Encounter: Payer: Self-pay | Admitting: Family Medicine

## 2014-11-03 VITALS — BP 128/86 | HR 80 | Temp 97.9°F | Resp 17 | Wt 231.6 lb

## 2014-11-03 DIAGNOSIS — M79661 Pain in right lower leg: Secondary | ICD-10-CM

## 2014-11-03 MED ORDER — KETOROLAC TROMETHAMINE 30 MG/ML IJ SOLN
30.0000 mg | Freq: Once | INTRAMUSCULAR | Status: AC
  Administered 2014-11-03: 30 mg via INTRAMUSCULAR

## 2014-11-03 MED ORDER — HYDROCODONE-ACETAMINOPHEN 10-325 MG PO TABS: 1.0000 | ORAL_TABLET | Freq: Three times a day (TID) | ORAL | Status: DC | PRN

## 2014-11-03 NOTE — Progress Notes (Unsigned)
Patient came in office for 5 pm appointment. Patient scheduled for 5pm on tomorrows schedule however documented as 5 pm today in chart. Spoke with late provider who states  He can see her today at a later time or patient can go to ED for evaluation if she is having pain and swelling in leg. Advised scheduler.

## 2014-11-03 NOTE — Progress Notes (Signed)
Pre visit review using our clinic review tool, if applicable. No additional management support is needed unless otherwise documented below in the visit note. 

## 2014-11-03 NOTE — Telephone Encounter (Signed)
[facility] - Client TELEPHONE ADVICE RECORD [facility]  Patient Name: Gail Mccann  DOB: 1960/05/17    Initial Comment Caller states she has leg pain, that is throbbing and painful.    Nurse Assessment  Nurse: Elwyn LadeBurress, RN, Gail Mccann Date/Time (Eastern Time): 11/03/2014 3:57:35 PM  Confirm and document reason for call. If symptomatic, describe symptoms. ---caller states has rt calf pain x 5-6 days, getting worse  Has the patient traveled out of the country within the last 30 days? ---No  Does the patient require triage? ---Yes  Related visit to physician within the last 2 weeks? ---No  Does the PT have any chronic conditions? (i.e. diabetes, asthma, etc.) ---Yes  List chronic conditions. ---low thyroid     Guidelines    Guideline Title Affirmed Question Affirmed Notes  Leg Pain [1] SEVERE pain (e.g., excruciating, unable to do any normal activities) AND [2] not improved after 2 hours of pain medicine    Final Disposition User   See Physician within 4 Hours (or PCP triage) Burress, RN, Gail Mccann    APPT SCHEDULED FOR 5PM TODAY

## 2014-11-03 NOTE — Patient Instructions (Signed)
Please go downstairs for your US. They will call me with your results before letting you leave.  Alternate Ice/Heat to leg. Rest over the next 2-3 days.  Take pain medication as directed.  If US shows clot of inflammation we will treat accordingly. If negative, continue care discussed today and follow-up on Monday if symptoms are not completely resolved.

## 2014-11-04 ENCOUNTER — Ambulatory Visit: Payer: Self-pay | Admitting: Medical

## 2014-11-04 DIAGNOSIS — M79669 Pain in unspecified lower leg: Secondary | ICD-10-CM | POA: Insufficient documentation

## 2014-11-04 NOTE — Telephone Encounter (Signed)
Notified patient of results, she stated understanding.  She states she is wearing tights but plans to get compression stockings today.  She agrees to rest, elevate leg, and alternate heat and ice.  She would like to follow-up Monday but is working currently and states will call office back shortly to schedule.

## 2014-11-04 NOTE — Progress Notes (Signed)
Patient presents to clinic today c/o worsening R calf pain first noticed 5 days ago. Pain is stabbing in nature and radiates to lateral upper leg. Denies trauma or injury. Denies swelling or bruising. Denies numbness or tingling of extremity. Patient denies hx of clot, recent surgery, long-distance travel or other prolonged immobilization. Denies chest pain or SOB. 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 Is scared she may have a clot because a family member suffered from one recently.  Past Medical History  Diagnosis Date  . Thyroid disease     Current Outpatient Prescriptions on File Prior to Visit  Medication Sig Dispense Refill  . B Complex-Biotin-FA (B-COMPLEX PO) Take by mouth.    . Calcium Carbonate-Vitamin D (CALCIUM 600 + D PO) Take by mouth.    . levothyroxine (SYNTHROID) 50 MCG tablet Take 1 tablet (50 mcg total) by mouth daily before breakfast. 30 tablet 1  . liothyronine (CYTOMEL) 25 MCG tablet Take 1 tablet (25 mcg total) by mouth daily. 90 tablet 0  . Melatonin 5 MG TABS Take by mouth.    . Multiple Vitamin (MULTIVITAMIN) tablet Take 1 tablet by mouth daily.    . Omega-3 Fatty Acids (FISH OIL) 1000 MG CAPS Take by mouth.    . Prenatal Vit-Fe Fumarate-FA (PRENATAL VITAMIN PO) Take by mouth.     No current facility-administered medications on file prior to visit.    No Known Allergies  Family History  Problem Relation Age of Onset  . Breast cancer Maternal Aunt   . Diabetes Maternal Grandmother   . Kidney disease Son     History   Social History  . Marital Status: Single    Spouse Name: N/A  . Number of Children: N/A  . Years of Education: N/A   Occupational History  . [employer]    Social History Main Topics  . Smoking status: Never Smoker   . Smokeless tobacco: Never Used  . Alcohol Use: No  . Drug Use: No  . Sexual Activity: Not on file   Other Topics Concern  . None   Social History Narrative     Exercise-- walk    Review of Systems - See HPI.  All other ROS are negative.  BP 128/86 mmHg  Pulse 80  Temp(Src) 97.9 F (36.6 C) (Oral)  Resp 17  Wt 231 lb 9 oz (105.036 kg)  SpO2 98%  Physical Exam  Constitutional: She is oriented to person, place, and time and well-developed, well-nourished, and in no distress.  HENT:  Head: Normocephalic and atraumatic.  Eyes: Conjunctivae are normal.  Cardiovascular: Normal rate, regular rhythm, normal heart sounds and intact distal pulses.   Pulmonary/Chest: Effort normal and breath sounds normal. No respiratory distress. She has no wheezes. She has no rales. She exhibits no tenderness.  Musculoskeletal:       Right lower leg: She exhibits tenderness. She exhibits no bony tenderness and no swelling.  Negative Homan sign. There is no appreciable swelling but significant tortuous veins noted of bilateral lower extremities.   Neurological: She is alert and oriented to person, place, and time.  Skin: Skin is warm and dry. No rash noted.  Psychiatric: Affect normal.  Vitals reviewed.   Recent Results (from the past 2160 hour(s))  Thyroid Panel With TSH     Status: Abnormal   Collection Time: 09/13/14 11:22 AM  Result Value Ref Range   T4, Total 7.6 4.5 - 12.0 ug/dL   T3 Uptake  27 22 - 35 %   Free Thyroxine Index 2.1 1.4 - 3.8   TSH 0.046 (L) 0.350 - 4.500 uIU/mL  ANA     Status: None   Collection Time: 09/13/14 11:22 AM  Result Value Ref Range   Anit Nuclear Antibody(ANA) NEG NEGATIVE  Rheumatoid factor     Status: None   Collection Time: 09/13/14 11:22 AM  Result Value Ref Range   Rhuematoid fact SerPl-aCnc <10 <=14 IU/mL    Comment:                            Interpretive Table                     Low Positive: 15 - 41 IU/mL                     High Positive:  >= 42 IU/mL    In addition to the RF result, and clinical symptoms including joint  involvement, the 2010 ACR Classification Criteria for  scoring/diagnosing  Rheumatoid Arthritis include the results of the  following tests:  CRP (93818), ESR (15010), and CCP ([facility]) (29937).  www.rheumatology.org/practice/clinical/classification/ra/ra_2010.asp   Vitamin B12     Status: None   Collection Time: 09/13/14 11:22 AM  Result Value Ref Range   Vitamin B-12 777 211 - 911 pg/mL  Vitamin D 1,25 dihydroxy     Status: None   Collection Time: 09/13/14 11:22 AM  Result Value Ref Range   Vitamin D 1, 25 (OH)2 Total 66 18 - 72 pg/mL   Vitamin D3 1, 25 (OH)2 66 pg/mL   Vitamin D2 1, 25 (OH)2 <8 pg/mL    Comment: Vitamin D3, 1,25(OH)2 indicates both endogenous production and supplementation.  Vitamin D2, 1,25(OH)2 is an indicator of exogeous sources, such as diet or supplementation.  Interpretation and therapy are based on measurement of Vitamin D,1,25(OH)2, Total. This test was developed and its analytical performance characteristics have been determined by [facility], [place], [place]. It has not been cleared or approved by the FDA. This assay has been validated pursuant to the CLIA regulations and is used for clinical purposes.   Basic metabolic panel     Status: None   Collection Time: 09/13/14 11:22 AM  Result Value Ref Range   Sodium 137 135 - 145 mEq/L   Potassium 3.8 3.5 - 5.1 mEq/L   Chloride 103 96 - 112 mEq/L   CO2 29 19 - 32 mEq/L   Glucose, Bld 91 70 - 99 mg/dL   BUN 11 6 - 23 mg/dL   Creatinine, Ser 0.62 0.40 - 1.20 mg/dL   Calcium 9.8 8.4 - 10.5 mg/dL   GFR 106.02 >60.00 mL/min  CBC with Differential/Platelet     Status: Abnormal   Collection Time: 09/13/14 11:22 AM  Result Value Ref Range   WBC 6.6 4.0 - 10.5 K/uL   RBC 5.13 (H) 3.87 - 5.11 Mil/uL   Hemoglobin 13.8 12.0 - 15.0 g/dL   HCT 40.9 36.0 - 46.0 %   MCV 79.8 78.0 - 100.0 fl   MCHC 33.6 30.0 - 36.0 g/dL   RDW 14.8 11.5 - 15.5 %   Platelets 291.0 150.0 - 400.0 K/uL   Neutrophils Relative % 58.5 43.0 - 77.0 %   Lymphocytes Relative 29.1 12.0 - 46.0  %   Monocytes Relative 6.6 3.0 - 12.0 %   Eosinophils Relative 5.2 (H) 0.0 - 5.0 %  Basophils Relative 0.6 0.0 - 3.0 %   Neutro Abs 3.9 1.4 - 7.7 K/uL   Lymphs Abs 1.9 0.7 - 4.0 K/uL   Monocytes Absolute 0.4 0.1 - 1.0 K/uL   Eosinophils Absolute 0.3 0.0 - 0.7 K/uL   Basophils Absolute 0.0 0.0 - 0.1 K/uL  Hepatic function panel     Status: Abnormal   Collection Time: 09/13/14 11:22 AM  Result Value Ref Range   Total Bilirubin 0.5 0.2 - 1.2 mg/dL   Bilirubin, Direct 0.1 0.0 - 0.3 mg/dL   Alkaline Phosphatase 158 (H) 39 - 117 U/L   AST 34 0 - 37 U/L   ALT 46 (H) 0 - 35 U/L   Total Protein 7.7 6.0 - 8.3 g/dL   Albumin 4.2 3.5 - 5.2 g/dL    Assessment/Plan: Calf pain STAT US negative for DVT but reveals large baker cyst. Can be contributing to pain but not convinced sole cause of symptoms. Suspect significant venous distention also part of pain. Rx Norco for pain. RICE recommended. Compression stockings recommended. Will follow-up on Monday for acute pain and discuss further interventions.

## 2014-11-04 NOTE — Telephone Encounter (Signed)
Evaluated patient last night at 6:15 in office. US obtained and negative for DVT so patient was allowed to leave imaging.   Will you please call her to again remind her that US negative for clot. Does show a baker's cyst which could indicate there has been some injury to the knee. Want her to rest and keep leg elevated. Get some compression stockings to add compression to her legs (will help with pain). Alternate ice/heat to legs and take pain medication as directed. I would like her to follow-up on Monday for repeat assessment. If not resolved at that time, we should get imaging of her knee.

## 2014-11-04 NOTE — Assessment & Plan Note (Signed)
STAT US negative for DVT but reveals large baker cyst. Can be contributing to pain but not convinced sole cause of symptoms. Suspect significant venous distention also part of pain. Rx Norco for pain. RICE recommended. Compression stockings recommended. Will follow-up on Monday for acute pain and discuss further interventions.

## 2014-11-05 ENCOUNTER — Telehealth: Payer: Self-pay | Admitting: Family Medicine

## 2014-11-05 NOTE — Telephone Encounter (Signed)
Caller name: Randalyn Relation to pt: Call back number: [phone] Pharmacy:  Reason for call:   Patient states that she is still having leg pain since last visit. Saw cody

## 2014-11-08 NOTE — Telephone Encounter (Signed)
Called and spoke with the pt and she stated that her leg is doing better.  She has been staying off it.  She stated that she feels it is a muscle, so she's going to work through it.  Informed the pt if she needs to have it rechecked to give us a call.  Pt agreed.//AB/CMA

## 2014-11-09 ENCOUNTER — Encounter: Payer: Self-pay | Admitting: Physician Assistant

## 2014-11-10 ENCOUNTER — Encounter: Payer: Self-pay | Admitting: Physician Assistant

## 2014-11-29 ENCOUNTER — Encounter: Payer: Self-pay | Admitting: Family Medicine

## 2014-11-29 ENCOUNTER — Ambulatory Visit (INDEPENDENT_AMBULATORY_CARE_PROVIDER_SITE_OTHER): Payer: No Typology Code available for payment source | Admitting: Family Medicine

## 2014-11-29 VITALS — BP 124/76 | HR 85 | Temp 98.1°F | Resp 18 | Ht 65.5 in | Wt 226.0 lb

## 2014-11-29 DIAGNOSIS — M791 Myalgia: Secondary | ICD-10-CM | POA: Diagnosis not present

## 2014-11-29 DIAGNOSIS — M609 Myositis, unspecified: Secondary | ICD-10-CM | POA: Diagnosis not present

## 2014-11-29 DIAGNOSIS — IMO0001 Reserved for inherently not codable concepts without codable children: Secondary | ICD-10-CM

## 2014-11-29 DIAGNOSIS — M25561 Pain in right knee: Secondary | ICD-10-CM

## 2014-11-29 DIAGNOSIS — E039 Hypothyroidism, unspecified: Secondary | ICD-10-CM

## 2014-11-29 DIAGNOSIS — M79661 Pain in right lower leg: Secondary | ICD-10-CM

## 2014-11-29 LAB — THYROID PANEL WITH TSH
FREE THYROXINE INDEX: 1 — AB (ref 1.4–3.8)
T3 Uptake: 25 % (ref 22–35)
T4, Total: 4 ug/dL — ABNORMAL LOW (ref 4.5–12.0)
TSH: 0.109 u[IU]/mL — ABNORMAL LOW (ref 0.350–4.500)

## 2014-11-29 MED ORDER — HYDROCODONE-ACETAMINOPHEN 10-325 MG PO TABS: 1.0000 | ORAL_TABLET | Freq: Three times a day (TID) | ORAL | Status: DC | PRN

## 2014-11-29 NOTE — Progress Notes (Signed)
Patient ID: Gail Mccann, female    DOB: [birth year]  Age: 54 y.o. MRN: [id number]    Subjective:  Subjective HPI Gail Mccann presents for f/u thyroid.  She c/o myalgias since changing dose of synthroid. She also c/o R knee pain-- no known injury.  She used to walk daily and is not able to because of the pain.    Review of Systems  Constitutional: Negative for diaphoresis, appetite change, fatigue and unexpected weight change.  Eyes: Negative for pain, redness and visual disturbance.  Respiratory: Negative for cough, chest tightness, shortness of breath and wheezing.   Cardiovascular: Negative for chest pain, palpitations and leg swelling.  Endocrine: Negative for cold intolerance, heat intolerance, polydipsia, polyphagia and polyuria.  Genitourinary: Negative for dysuria, frequency and difficulty urinating.  Musculoskeletal: Positive for myalgias and arthralgias.  Neurological: Negative for dizziness, light-headedness, numbness and headaches.  Psychiatric/Behavioral: Positive for dysphoric mood. Negative for suicidal ideas, sleep disturbance and self-injury. The patient is not nervous/anxious.     History Past Medical History  Diagnosis Date  . Thyroid disease     She has past surgical history that includes Tubal ligation (1986); Cesarean section; Abdominal hysterectomy (2012); Tonsillectomy (1968); Nasal sinus surgery (1986); and Tonsillectomy.   Her family history includes Breast cancer in her maternal aunt; Diabetes in her maternal grandmother; Kidney disease in her son.She reports that she has never smoked. She has never used smokeless tobacco. She reports that she does not drink alcohol or use illicit drugs.  Current Outpatient Prescriptions on File Prior to Visit  Medication Sig Dispense Refill  . B Complex-Biotin-FA (B-COMPLEX PO) Take by mouth.    . Calcium Carbonate-Vitamin D (CALCIUM 600 + D PO) Take by mouth.    . levothyroxine (SYNTHROID) 50 MCG tablet Take 1 tablet  (50 mcg total) by mouth daily before breakfast. 30 tablet 1  . liothyronine (CYTOMEL) 25 MCG tablet Take 1 tablet (25 mcg total) by mouth daily. 90 tablet 0  . Melatonin 5 MG TABS Take by mouth.    . Multiple Vitamin (MULTIVITAMIN) tablet Take 1 tablet by mouth daily.    . Omega-3 Fatty Acids (FISH OIL) 1000 MG CAPS Take by mouth.    . Prenatal Vit-Fe Fumarate-FA (PRENATAL VITAMIN PO) Take by mouth.     No current facility-administered medications on file prior to visit.     Objective:  Objective Physical Exam  Constitutional: She is oriented to person, place, and time. She appears well-developed and well-nourished.  HENT:  Head: Normocephalic and atraumatic.  Eyes: Conjunctivae and EOM are normal.  Neck: Normal range of motion. Neck supple. No JVD present. Carotid bruit is not present. No thyromegaly present.  Cardiovascular: Normal rate, regular rhythm and normal heart sounds.   No murmur heard. Pulmonary/Chest: Effort normal and breath sounds normal. No respiratory distress. She has no wheezes. She has no rales. She exhibits no tenderness.  Musculoskeletal: She exhibits edema.       Right knee: She exhibits swelling.       Legs: Neurological: She is alert and oriented to person, place, and time.  Psychiatric: She has a normal mood and affect.   BP 124/76 mmHg  Pulse 85  Temp(Src) 98.1 F (36.7 C) (Oral)  Resp 18  Ht 5' 5.5" (1.664 m)  Wt 226 lb (102.513 kg)  BMI 37.02 kg/m2  SpO2 98% Wt Readings from Last 3 Encounters:  11/29/14 226 lb (102.513 kg)  11/03/14 231 lb 9 oz (105.036 kg)  09/13/14 229 lb (103.874  kg)     Lab Results  Component Value Date   WBC 6.6 09/13/2014   HGB 13.8 09/13/2014   HCT 40.9 09/13/2014   PLT 291.0 09/13/2014   GLUCOSE 91 09/13/2014   CHOL 171 12/16/2013   TRIG 45.0 12/16/2013   HDL 69.80 12/16/2013   LDLCALC 92 12/16/2013   ALT 46* 09/13/2014   AST 34 09/13/2014   NA 137 09/13/2014   K 3.8 09/13/2014   CL 103 09/13/2014    CREATININE 0.62 09/13/2014   BUN 11 09/13/2014   CO2 29 09/13/2014   TSH 0.046* 09/13/2014    US Venous Img Lower Unilateral Right  11/03/2014   CLINICAL DATA:  54 year old female with right calf pain.  EXAM: RIGHT LOWER EXTREMITY VENOUS DOPPLER ULTRASOUND  TECHNIQUE: Gray-scale sonography with graded compression, as well as color Doppler and duplex ultrasound were performed to evaluate the lower extremity deep venous systems from the level of the common femoral vein and including the common femoral, femoral, profunda femoral, popliteal and calf veins including the posterior tibial, peroneal and gastrocnemius veins when visible. The superficial great saphenous vein was also interrogated. Spectral Doppler was utilized to evaluate flow at rest and with distal augmentation maneuvers in the common femoral, femoral and popliteal veins.  COMPARISON:  None.  FINDINGS: Contralateral Common Femoral Vein: Respiratory phasicity is normal and symmetric with the symptomatic side. No evidence of thrombus. Normal compressibility.  Common Femoral Vein: No evidence of thrombus. Normal compressibility, respiratory phasicity and response to augmentation.  Saphenofemoral Junction: No evidence of thrombus. Normal compressibility and flow on color Doppler imaging.  Profunda Femoral Vein: No evidence of thrombus. Normal compressibility and flow on color Doppler imaging.  Femoral Vein: No evidence of thrombus. Normal compressibility, respiratory phasicity and response to augmentation.  Popliteal Vein: No evidence of thrombus. Normal compressibility, respiratory phasicity and response to augmentation.  Calf Veins: No evidence of thrombus. Normal compressibility and flow on color Doppler imaging.  Superficial Great Saphenous Vein: No evidence of thrombus. Normal compressibility and flow on color Doppler imaging.  A 3.3 by 0.7 x 2.2 cm right popliteal fossa Baker's cyst noted.  IMPRESSION:  No evidence of deep venous thrombosis.    Electronically Signed   By: Elgie Collard M.D.   On: 11/03/2014 19:20     Assessment & Plan:  Plan I am having Ms. Goyne maintain her Melatonin, Calcium Carbonate-Vitamin D (CALCIUM 600 + D PO), multivitamin, liothyronine, B Complex-Biotin-FA (B-COMPLEX PO), Fish Oil, Prenatal Vit-Fe Fumarate-FA (PRENATAL VITAMIN PO), levothyroxine, and HYDROcodone-acetaminophen.  Meds ordered this encounter  Medications  . HYDROcodone-acetaminophen (NORCO) 10-325 MG per tablet    Sig: Take 1 tablet by mouth every 8 (eight) hours as needed.    Dispense:  30 tablet    Refill:  0    Problem List Items Addressed This Visit    RESOLVED: Calf pain   Relevant Medications   HYDROcodone-acetaminophen (NORCO) 10-325 MG per tablet    Other Visit Diagnoses    Hypothyroidism, unspecified hypothyroidism type    -  Primary    Relevant Orders    Thyroid Panel With TSH    Myalgia and myositis        Relevant Orders    Basic metabolic panel    CBC with Differential/Platelet    Hepatic function panel    POCT urinalysis dipstick    Vitamin D 1,25 dihydroxy    ANA    Right knee pain        Relevant Orders  Ambulatory referral to Orthopedic Surgery     refill pain meds x 1 until she can see ortho for knee pain  Follow-up: Return if symptoms worsen or fail to improve.  Loreen Freud, DO

## 2014-11-29 NOTE — Patient Instructions (Signed)
Hypothyroidism The thyroid is a large gland located in the lower front of your neck. The thyroid gland helps control metabolism. Metabolism is how your body handles food. It controls metabolism with the hormone thyroxine. When this gland is underactive (hypothyroid), it produces too little hormone.  CAUSES These include:   Absence or destruction of thyroid tissue.  Goiter due to iodine deficiency.  Goiter due to medications.  Congenital defects (since birth).  Problems with the pituitary. This causes a lack of TSH (thyroid stimulating hormone). This hormone tells the thyroid to turn out more hormone. SYMPTOMS  Lethargy (feeling as though you have no energy)  Cold intolerance  Weight gain (in spite of normal food intake)  Dry skin  Coarse hair  Menstrual irregularity (if severe, may lead to infertility)  Slowing of thought processes Cardiac problems are also caused by insufficient amounts of thyroid hormone. Hypothyroidism in the newborn is cretinism, and is an extreme form. It is important that this form be treated adequately and immediately or it will lead rapidly to retarded physical and mental development. DIAGNOSIS  To prove hypothyroidism, your caregiver may do blood tests and ultrasound tests. Sometimes the signs are hidden. It may be necessary for your caregiver to watch this illness with blood tests either before or after diagnosis and treatment. TREATMENT  Low levels of thyroid hormone are increased by using synthetic thyroid hormone. This is a safe, effective treatment. It usually takes about four weeks to gain the full effects of the medication. After you have the full effect of the medication, it will generally take another four weeks for problems to leave. Your caregiver may start you on low doses. If you have had heart problems the dose may be gradually increased. It is generally not an emergency to get rapidly to normal. HOME CARE INSTRUCTIONS   Take your  medications as your caregiver suggests. Let your caregiver know of any medications you are taking or start taking. Your caregiver will help you with dosage schedules.  As your condition improves, your dosage needs may increase. It will be necessary to have continuing blood tests as suggested by your caregiver.  Report all suspected medication side effects to your caregiver. SEEK MEDICAL CARE IF: Seek medical care if you develop:  Sweating.  Tremulousness (tremors).  Anxiety.  Rapid weight loss.  Heat intolerance.  Emotional swings.  Diarrhea.  Weakness. SEEK IMMEDIATE MEDICAL CARE IF:  You develop chest pain, an irregular heart beat (palpitations), or a rapid heart beat. MAKE SURE YOU:   Understand these instructions.  Will watch your condition.  Will get help right away if you are not doing well or get worse. Document Released: 04/16/2005 Document Revised: 07/09/2011 Document Reviewed: 12/05/2007 Exit[employer] Patient Information 2015 [employer]. This information is not intended to replace advice given to you by your health care provider. Make sure you discuss any questions you have with your health care provider.  

## 2014-11-29 NOTE — Progress Notes (Signed)
Pre visit review using our clinic review tool, if applicable. No additional management support is needed unless otherwise documented below in the visit note. 

## 2014-11-30 ENCOUNTER — Other Ambulatory Visit: Payer: Self-pay | Admitting: Family Medicine

## 2014-11-30 ENCOUNTER — Encounter: Payer: Self-pay | Admitting: Family Medicine

## 2014-11-30 DIAGNOSIS — E039 Hypothyroidism, unspecified: Secondary | ICD-10-CM

## 2014-11-30 LAB — BASIC METABOLIC PANEL WITH GFR
BUN: 17 mg/dL (ref 6–23)
CO2: 27 meq/L (ref 19–32)
Calcium: 9.5 mg/dL (ref 8.4–10.5)
Chloride: 102 meq/L (ref 96–112)
Creatinine, Ser: 0.79 mg/dL (ref 0.40–1.20)
GFR: 80.1 mL/min
Glucose, Bld: 76 mg/dL (ref 70–99)
Potassium: 3.9 meq/L (ref 3.5–5.1)
Sodium: 138 meq/L (ref 135–145)

## 2014-11-30 LAB — CBC WITH DIFFERENTIAL/PLATELET
BASOS ABS: 0.2 10*3/uL — AB (ref 0.0–0.1)
Basophils Relative: 1.8 % (ref 0.0–3.0)
Eosinophils Absolute: 0.2 10*3/uL (ref 0.0–0.7)
Eosinophils Relative: 1.9 % (ref 0.0–5.0)
HEMATOCRIT: 41.6 % (ref 36.0–46.0)
Hemoglobin: 14 g/dL (ref 12.0–15.0)
Lymphocytes Relative: 26.1 % (ref 12.0–46.0)
Lymphs Abs: 2.2 10*3/uL (ref 0.7–4.0)
MCHC: 33.6 g/dL (ref 30.0–36.0)
MCV: 80.6 fl (ref 78.0–100.0)
Monocytes Absolute: 0.4 10*3/uL (ref 0.1–1.0)
Monocytes Relative: 5 % (ref 3.0–12.0)
Neutro Abs: 5.5 10*3/uL (ref 1.4–7.7)
Neutrophils Relative %: 65.2 % (ref 43.0–77.0)
Platelets: 305 10*3/uL (ref 150.0–400.0)
RBC: 5.16 Mil/uL — ABNORMAL HIGH (ref 3.87–5.11)
RDW: 15.2 % (ref 11.5–15.5)
WBC: 8.4 10*3/uL (ref 4.0–10.5)

## 2014-11-30 LAB — HEPATIC FUNCTION PANEL
ALT: 54 U/L — ABNORMAL HIGH (ref 0–35)
AST: 39 U/L — ABNORMAL HIGH (ref 0–37)
Albumin: 4.3 g/dL (ref 3.5–5.2)
Alkaline Phosphatase: 164 U/L — ABNORMAL HIGH (ref 39–117)
Bilirubin, Direct: 0.1 mg/dL (ref 0.0–0.3)
Total Bilirubin: 0.4 mg/dL (ref 0.2–1.2)
Total Protein: 7.6 g/dL (ref 6.0–8.3)

## 2014-11-30 LAB — ANA: Anti Nuclear Antibody(ANA): NEGATIVE

## 2014-12-01 NOTE — Telephone Encounter (Signed)
-----   Message from Lelon Perla, DO sent at 11/30/2014  7:27 PM EDT ----- She is on the max dose of synthroid I feel comfortable writing and she is still hypothyroid--- we have to get her to a specialist---- I can't believe her ins won't pay for it.  Please have marj check.   She needs more meds.

## 2014-12-02 ENCOUNTER — Other Ambulatory Visit: Payer: Self-pay | Admitting: Family Medicine

## 2014-12-02 DIAGNOSIS — E039 Hypothyroidism, unspecified: Secondary | ICD-10-CM

## 2014-12-02 LAB — VITAMIN D 1,25 DIHYDROXY
VITAMIN D 1, 25 (OH) TOTAL: 47 pg/mL (ref 18–72)
Vitamin D2 1, 25 (OH)2: <8 pg/mL
Vitamin D3 1, 25 (OH)2: 47 pg/mL

## 2014-12-02 MED ORDER — LIOTHYRONINE SODIUM 25 MCG PO TABS: 25.0000 ug | ORAL_TABLET | Freq: Every day | ORAL | Status: DC

## 2014-12-02 MED ORDER — LEVOTHYROXINE SODIUM 50 MCG PO TABS: 50.0000 ug | ORAL_TABLET | Freq: Every day | ORAL | Status: DC

## 2014-12-02 NOTE — Addendum Note (Signed)
Addended by: Arnette Norris on: 12/02/2014 02:01 PM   Modules accepted: Orders

## 2014-12-02 NOTE — Telephone Encounter (Signed)
Ok to refill med for 3 months

## 2015-01-10 ENCOUNTER — Ambulatory Visit: Payer: No Typology Code available for payment source | Admitting: Endocrinology

## 2015-05-06 ENCOUNTER — Ambulatory Visit: Payer: No Typology Code available for payment source | Admitting: Osteopathic Medicine

## 2015-05-09 ENCOUNTER — Encounter: Payer: Self-pay | Admitting: Osteopathic Medicine

## 2015-05-16 ENCOUNTER — Ambulatory Visit (INDEPENDENT_AMBULATORY_CARE_PROVIDER_SITE_OTHER): Payer: 59 | Admitting: Osteopathic Medicine

## 2015-05-16 ENCOUNTER — Encounter: Payer: Self-pay | Admitting: Osteopathic Medicine

## 2015-05-16 VITALS — BP 138/80 | HR 63 | Ht 65.5 in | Wt 231.0 lb

## 2015-05-16 DIAGNOSIS — E039 Hypothyroidism, unspecified: Secondary | ICD-10-CM

## 2015-05-16 DIAGNOSIS — M791 Myalgia: Secondary | ICD-10-CM

## 2015-05-16 DIAGNOSIS — IMO0001 Reserved for inherently not codable concepts without codable children: Secondary | ICD-10-CM

## 2015-05-16 DIAGNOSIS — M609 Myositis, unspecified: Secondary | ICD-10-CM

## 2015-05-16 DIAGNOSIS — Z13220 Encounter for screening for lipoid disorders: Secondary | ICD-10-CM

## 2015-05-16 DIAGNOSIS — Z1159 Encounter for screening for other viral diseases: Secondary | ICD-10-CM

## 2015-05-16 DIAGNOSIS — Z780 Asymptomatic menopausal state: Secondary | ICD-10-CM | POA: Diagnosis not present

## 2015-05-16 DIAGNOSIS — J309 Allergic rhinitis, unspecified: Secondary | ICD-10-CM

## 2015-05-16 DIAGNOSIS — Z114 Encounter for screening for human immunodeficiency virus [HIV]: Secondary | ICD-10-CM

## 2015-05-16 LAB — LIPID PANEL
Cholesterol: 198 mg/dL (ref 0–200)
HDL: 73 mg/dL — AB (ref 35–70)
LDL CALC: 116 mg/dL
Triglycerides: 45 mg/dL (ref 40–160)

## 2015-05-16 LAB — CBC AND DIFFERENTIAL
HCT: 40 % (ref 36–46)
HEMOGLOBIN: 13.6 g/dL (ref 12.0–16.0)

## 2015-05-16 LAB — THYROXINE (T4) FREE, DIRECT: T4,Free (Direct): 0.43

## 2015-05-16 LAB — VITAMIN D 25 HYDROXY (VIT D DEFICIENCY, FRACTURES): Vit D, 25-Hydroxy: 43.6

## 2015-05-16 LAB — BASIC METABOLIC PANEL
BUN: 12 mg/dL (ref 4–21)
Creatinine: 0.7 mg/dL (ref 0.5–1.1)
Glucose: 93 mg/dL

## 2015-05-16 LAB — TSH
T3 TOTAL: 101
TSH: 5.24 u[IU]/mL (ref 0.41–5.90)

## 2015-05-16 LAB — HEPATIC FUNCTION PANEL
ALK PHOS: 182 U/L — AB (ref 25–125)
ALT: 66 U/L — AB (ref 7–35)
AST: 40 U/L — AB (ref 13–35)
Bilirubin, Total: 0.3 mg/dL

## 2015-05-16 LAB — HCV AB W REFLEX TO QUANT PCR: HEP C VIRUS AB: 1

## 2015-05-16 LAB — HIV ANTIBODY (ROUTINE TESTING W REFLEX): HIV SCREEN 4TH GENERATION: NONREACTIVE

## 2015-05-16 LAB — CBC WITH DIFFERENTIAL: MCV: 84

## 2015-05-16 MED ORDER — LEVOTHYROXINE SODIUM 50 MCG PO TABS: 50.0000 ug | ORAL_TABLET | Freq: Every day | ORAL | Status: DC

## 2015-05-16 MED ORDER — LIOTHYRONINE SODIUM 25 MCG PO TABS: 25.0000 ug | ORAL_TABLET | Freq: Every day | ORAL | Status: DC

## 2015-05-16 NOTE — Progress Notes (Signed)
HPI: Gail Mccann is a 55 y.o. female who presents to [facility] today for chief complaint of:  Chief Complaint  Patient presents with  . Establish Care    Thyroid problems - requests referral, joint pain - requests fill out handicap paperwork    Thyroid: Patient has been off of medications for about 3 months due to lack of insurance, she requests referral to endocrinology, seeing the previous doctors have gone up and down on her thyroid medicine dose, don't seem to know what's going on, she would feel more secure talking to an endocrinologist about this issue.  Joint pain: Patient reports persistent knee pain, myalgias and lower legs, no claudication symptoms, she states that she would like to renew her temporary handicap placard for her car, patient brings the paperwork with her today, in the past has been worked up with a N/A rheumatoid factor which were both negative, declines further testing at this time. Patient states that her joint pain and myalgia typically go away when her thyroid is normal.  Sinuses: Patient states that she previously has sinus surgery, she reports sniffling and irritation lately, no fever or chills, no sinus pressure, headache, vision change.  Past medical, social and family history reviewed: Past Medical History  Diagnosis Date  . Thyroid disease    Past Surgical History  Procedure Laterality Date  . Tubal ligation  1986  . Cesarean section      x's 2  . Abdominal hysterectomy  2012  . Tonsillectomy  1968  . Nasal sinus surgery  1986  . Tonsillectomy     Social History  Substance Use Topics  . Smoking status: Never Smoker   . Smokeless tobacco: Never Used  . Alcohol Use: No   Family History  Problem Relation Age of Onset  . Breast cancer Maternal Aunt   . Diabetes Maternal Grandmother   . Kidney disease Son     Current Outpatient Prescriptions  Medication Sig Dispense Refill  . B Complex-Biotin-FA  (B-COMPLEX PO) Take by mouth.    . Calcium Carbonate-Vitamin D (CALCIUM 600 + D PO) Take by mouth.    Marland Kitchen HYDROcodone-acetaminophen (NORCO) 10-325 MG per tablet Take 1 tablet by mouth every 8 (eight) hours as needed. 30 tablet 0  . Melatonin 5 MG TABS Take by mouth.    . Multiple Vitamin (MULTIVITAMIN) tablet Take 1 tablet by mouth daily.    . Omega-3 Fatty Acids (FISH OIL) 1000 MG CAPS Take by mouth.    . Prenatal Vit-Fe Fumarate-FA (PRENATAL VITAMIN PO) Take by mouth.    . levothyroxine (SYNTHROID) 50 MCG tablet Take 1 tablet (50 mcg total) by mouth daily before breakfast. (Patient not taking: Reported on 05/16/2015) 30 tablet 2  . liothyronine (CYTOMEL) 25 MCG tablet Take 1 tablet (25 mcg total) by mouth daily. (Patient not taking: Reported on 05/16/2015) 90 tablet 0   No current facility-administered medications for this visit.   No Known Allergies    Review of Systems: CONSTITUTIONAL:  No  fever, no chills, No  unintentional weight changes HEAD/EYES/EARS/NOSE/THROAT: No  headache, no vision change, no hearing change, No  sore throat, (+) sinus pressure CARDIAC: No  chest pain, No  pressure, No palpitations, No  orthopnea RESPIRATORY: No  cough, No  shortness of breath/wheeze GASTROINTESTINAL: No  nausea, No  vomiting, No  abdominal pain, No  blood in stool, No  diarrhea, No  constipation  MUSCULOSKELETAL: (+) myalgia/arthralgia GENITOURINARY: No  incontinence, No  abnormal genital  bleeding/discharge SKIN: No  rash/wounds/concerning lesions HEM/ONC: No  easy bruising/bleeding, No  abnormal lymph node ENDOCRINE: No polyuria/polydipsia/polyphagia, No  heat/cold intolerance  NEUROLOGIC: No  weakness, No  dizziness, No  slurred speech, (+) fatigue PSYCHIATRIC: No  concerns with depression, No  concerns with anxiety, No sleep problems  Exam:  BP 138/80 mmHg  Pulse 63  Ht 5' 5.5" (1.664 m)  Wt 231 lb (104.781 kg)  BMI 37.84 kg/m2 Constitutional: VS see above. General Appearance: alert,  well-developed, well-nourished, NAD Eyes: Normal lids and conjunctive, non-icteric sclera, PERRLA Ears, Nose, Mouth, Throat: MMM, Normal external inspection ears/nares/mouth/lips/gums, TM normal bilaterally. Pharynx no erythema, no exudate.  Neck: No masses, trachea midline. No thyroid enlargement/tenderness/mass appreciated. No lymphadenopathy Respiratory: Normal respiratory effort. no wheeze, no rhonchi, no rales Cardiovascular: S1/S2 normal, no murmur, no rub/gallop auscultated. RRR.  Gastrointestinal: Nontender, no masses. Musculoskeletal: Gait normal. No clubbing/cyanosis of digits. Very mild crepitus to knees bilaterally, ligaments are stable bilaterally in knees Neurological: No cranial nerve deficit on limited exam. Motor and sensation intact and symmetric Skin: warm, dry, intact. No rash/ulcer. No concerning nevi or subq nodules on limited exam.   Psychiatric: Normal judgment/insight. Anxious mood and affect. Oriented x3.   No results found for this or any previous visit (from the past 72 hour(s)).    ASSESSMENT/PLAN:  Hypothyroidism, unspecified hypothyroidism type - Plan: Ambulatory referral to Endocrinology, T4, free, TSH, T3, liothyronine (CYTOMEL) 25 MCG tablet, levothyroxine (SYNTHROID) 50 MCG tablet, TSH + free T4, T3 - refilled medications as above, referral placed to endocrinology per patient preference, advised patient if labs are grossly abnormal we may need to adjust medications until she can get in to be seen by endocrinology, patient states she would rather just take when she was on before unless absolutely necessary  Myalgia and myositis - Plan: CBC with Differential/Platelet, COMPLETE METABOLIC PANEL WITH GFR, CBC With Differential, Comprehensive Metabolic Panel (CMET)  Lipid screening - Plan: Lipid panel, Lipid panel  Postmenopausal - Plan: VITAMIN D 25 Hydroxy (Vit-D Deficiency, Fractures), Vitamin D (25 hydroxy)  Screening for HIV (human immunodeficiency virus) -  Plan: HIV antibody, HIV antibody  Need for hepatitis C screening test - Plan: Hepatitis C antibody, Hepatitis C antibody  Allergic rhinitis, unspecified allergic rhinitis type - advised over-the-counter Flonase, if this isn't working within a week or so, would consider adding oral antihistamine such as Zyrtec, Claritin or similar.    Return in about 6 months (around 11/13/2015), or sooner if needed, for Annual Preventive Care Physical.

## 2015-05-16 NOTE — Patient Instructions (Signed)

## 2015-05-17 ENCOUNTER — Telehealth: Payer: Self-pay | Admitting: Osteopathic Medicine

## 2015-05-17 ENCOUNTER — Encounter: Payer: Self-pay | Admitting: Osteopathic Medicine

## 2015-05-17 DIAGNOSIS — E039 Hypothyroidism, unspecified: Secondary | ICD-10-CM

## 2015-05-17 NOTE — Telephone Encounter (Signed)
Please call patient: Reviewed lab results from [facility] - Thyroid function is low, I sent her meds in please make sure she restarts these. Other labs ok with the exception of mildly high liver enzymes and borderline cholesterol - I would repeat these labs (particularly liver) in 8 weeks when she should need to get her thyroid rechecked. Make sure her endocrinologist know to check the scanned documents in the chart for the labs since [facility] doesn't automatically upload results to our chart system. Thanks.

## 2015-05-17 NOTE — Telephone Encounter (Signed)
Left message on patient vm with results and information as noted below. Deontae Robson,CMA

## 2015-06-08 ENCOUNTER — Other Ambulatory Visit: Payer: Self-pay | Admitting: Osteopathic Medicine

## 2015-06-10 ENCOUNTER — Other Ambulatory Visit: Payer: Self-pay | Admitting: Family Medicine

## 2015-06-10 DIAGNOSIS — R7989 Other specified abnormal findings of blood chemistry: Secondary | ICD-10-CM

## 2015-06-10 DIAGNOSIS — R945 Abnormal results of liver function studies: Principal | ICD-10-CM

## 2015-06-10 DIAGNOSIS — Z1231 Encounter for screening mammogram for malignant neoplasm of breast: Secondary | ICD-10-CM

## 2015-07-07 ENCOUNTER — Other Ambulatory Visit: Payer: 59

## 2015-07-07 ENCOUNTER — Ambulatory Visit: Payer: 59

## 2015-07-18 ENCOUNTER — Other Ambulatory Visit: Payer: 59

## 2015-07-18 ENCOUNTER — Ambulatory Visit: Payer: 59

## 2015-07-29 ENCOUNTER — Other Ambulatory Visit: Payer: Self-pay | Admitting: Gastroenterology

## 2015-08-29 ENCOUNTER — Other Ambulatory Visit: Payer: Self-pay | Admitting: Family Medicine

## 2015-08-29 ENCOUNTER — Ambulatory Visit
Admission: RE | Admit: 2015-08-29 | Discharge: 2015-08-29 | Disposition: A | Discharge status: Home / Self Care | Payer: 59 | Source: Ambulatory Visit | Attending: Family Medicine | Admitting: Family Medicine

## 2015-08-29 DIAGNOSIS — Z1231 Encounter for screening mammogram for malignant neoplasm of breast: Secondary | ICD-10-CM

## 2016-08-08 ENCOUNTER — Emergency Department (HOSPITAL_COMMUNITY)
Admission: EM | Admit: 2016-08-08 | Discharge: 2016-08-08 | Disposition: A | Discharge status: Home / Self Care | Payer: Worker's Compensation | Attending: Emergency Medicine | Admitting: Emergency Medicine

## 2016-08-08 ENCOUNTER — Encounter (HOSPITAL_COMMUNITY): Payer: Self-pay | Admitting: Emergency Medicine

## 2016-08-08 ENCOUNTER — Emergency Department (HOSPITAL_COMMUNITY): Payer: Worker's Compensation

## 2016-08-08 DIAGNOSIS — Y990 Civilian activity done for income or pay: Secondary | ICD-10-CM | POA: Diagnosis not present

## 2016-08-08 DIAGNOSIS — M79604 Pain in right leg: Secondary | ICD-10-CM | POA: Diagnosis present

## 2016-08-08 DIAGNOSIS — E039 Hypothyroidism, unspecified: Secondary | ICD-10-CM | POA: Insufficient documentation

## 2016-08-08 DIAGNOSIS — Y939 Activity, unspecified: Secondary | ICD-10-CM | POA: Diagnosis not present

## 2016-08-08 DIAGNOSIS — Y929 Unspecified place or not applicable: Secondary | ICD-10-CM | POA: Diagnosis not present

## 2016-08-08 DIAGNOSIS — W010XXA Fall on same level from slipping, tripping and stumbling without subsequent striking against object, initial encounter: Secondary | ICD-10-CM | POA: Insufficient documentation

## 2016-08-08 DIAGNOSIS — M7918 Myalgia, other site: Secondary | ICD-10-CM

## 2016-08-08 MED ORDER — METHOCARBAMOL 500 MG PO TABS: 500.0000 mg | ORAL_TABLET | Freq: Two times a day (BID) | ORAL | 0 refills | Status: DC

## 2016-08-08 MED ORDER — METHOCARBAMOL 500 MG PO TABS
500.0000 mg | ORAL_TABLET | Freq: Two times a day (BID) | ORAL | 0 refills | Status: DC
Start: 2016-08-08 — End: 2018-12-23

## 2016-08-08 NOTE — Discharge Instructions (Signed)
Please read and follow all provided instructions.  Your diagnoses today include:  1. Right leg pain   2. Musculoskeletal pain     Tests performed today include: Vital signs. See below for your results today.   Medications prescribed:  Take as prescribed   Home care instructions:  Follow any educational materials contained in this packet.  Follow-up instructions: Please follow-up with your primary care provider for further evaluation of symptoms and treatment   Return instructions:  Please return to the Emergency Department if you do not get better, if you get worse, or new symptoms OR  - Fever (temperature greater than 101.35F)  - Bleeding that does not stop with holding pressure to the area    -Severe pain (please note that you may be more sore the day after your accident)  - Chest Pain  - Difficulty breathing  - Severe nausea or vomiting  - Inability to tolerate food and liquids  - Passing out  - Skin becoming red around your wounds  - Change in mental status (confusion or lethargy)  - New numbness or weakness    Please return if you have any other emergent concerns.  Additional Information:  Your vital signs today were: BP (!) 123/92 (BP Location: Left Arm)    Pulse 85    Temp 98.2 F (36.8 C) (Oral)    Resp 16    Ht  (1.676 m)    Wt 109.8 kg    SpO2 98%    BMI 39.06 kg/m  If your blood pressure (BP) was elevated above 135/85 this visit, please have this repeated by your doctor within one month. ---------------

## 2016-08-08 NOTE — ED Notes (Signed)
Papers reviewed and patient verbalizes understanding   

## 2016-08-08 NOTE — ED Provider Notes (Signed)
[facility]-EMERGENCY DEPT Provider Note   CSN: [id number] Arrival date & time: 08/08/16  1307   By signing my name below, I, Gail Mccann, attest that this documentation has been prepared under the direction and in the presence of Audry Pili, PA-C. Electronically Signed: Freida Mccann, Scribe. 08/08/2016. 2:32 PM.  History   Chief Complaint Chief Complaint  Patient presents with  . Leg Injury  . Hand Injury    The history is provided by the patient. No language interpreter was used.     HPI Comments:  Gail Mccann is a 56 y.o. female who presents to the Emergency Department s/p fall yesterday complaining of intermittent RLE numbness since the accident with associated pain to the RLE. She reports pain from the right hip down through the right thigh. Pt states she slipped on water while at work and landed in an incomplete split. Pt went to [facility] afterward and was given a cortisone shot. She then began to experience numbness to the left index, middle, and ring fingers. Intermittent. No neck pain. She is unsure if she injured the left hand during the fall. Pt states she woke up without numbness but symptom returned today ~1000 and has been constant since. No alleviating factors noted. No LOC or head injury. No back pain. No loss of bowel or bladder function. No saddle anesthesia. No other symptoms noted.    Past Medical History:  Diagnosis Date  . Thyroid disease     Patient Active Problem List   Diagnosis Date Noted  . Myalgia and myositis 05/16/2015  . Postmenopausal 05/16/2015  . Acute bronchitis 07/26/2013  . Hypothyroid 05/27/2012    Past Surgical History:  Procedure Laterality Date  . ABDOMINAL HYSTERECTOMY  2012  . CESAREAN SECTION     x's 2  . NASAL SINUS SURGERY  1986  . TONSILLECTOMY  1968  . TONSILLECTOMY    . TUBAL LIGATION  1986    OB History    No data available       Home Medications    Prior to Admission medications   Medication Sig Start Date End  Date Taking? Authorizing Provider  B Complex-Biotin-FA (B-COMPLEX PO) Take by mouth.    Historical Provider, MD  Calcium Carbonate-Vitamin D (CALCIUM 600 + D PO) Take by mouth.    Historical Provider, MD  HYDROcodone-acetaminophen (NORCO) 10-325 MG per tablet Take 1 tablet by mouth every 8 (eight) hours as needed. 11/29/14   Lelon Perla Chase, DO  levothyroxine (SYNTHROID) 50 MCG tablet Take 1 tablet (50 mcg total) by mouth daily before breakfast. 05/16/15   Sunnie Nielsen, DO  liothyronine (CYTOMEL) 25 MCG tablet TAKE ONE TABLET BY MOUTH ONCE DAILY 06/09/15   Sunnie Nielsen, DO  Melatonin 5 MG TABS Take by mouth.    Historical Provider, MD  Multiple Vitamin (MULTIVITAMIN) tablet Take 1 tablet by mouth daily.    Historical Provider, MD  Omega-3 Fatty Acids (FISH OIL) 1000 MG CAPS Take by mouth.    Historical Provider, MD  Prenatal Vit-Fe Fumarate-FA (PRENATAL VITAMIN PO) Take by mouth.    Historical Provider, MD    Family History Family History  Problem Relation Age of Onset  . Breast cancer Maternal Aunt   . Diabetes Maternal Grandmother   . Kidney disease Son     Social History Social History  Substance Use Topics  . Smoking status: Never Smoker  . Smokeless tobacco: Never Used  . Alcohol use No     Allergies   Patient  has no known allergies.   Review of Systems Review of Systems  Constitutional: Negative for chills and fever.  Respiratory: Negative for shortness of breath.   Cardiovascular: Negative for chest pain.  Musculoskeletal: Positive for arthralgias and myalgias.  Neurological: Positive for numbness. Negative for syncope and headaches.    Physical Exam Updated Vital Signs BP (!) 123/92 (BP Location: Left Arm)   Pulse 85   Temp 98.2 F (36.8 C) (Oral)   Resp 16   Ht  (1.676 m)   Wt 242 lb (109.8 kg)   SpO2 98%   BMI 39.06 kg/m   Physical Exam  Constitutional: She is oriented to person, place, and time. Vital signs are normal. She appears  well-developed and well-nourished. No distress.  HENT:  Head: Normocephalic and atraumatic.  Right Ear: Hearing normal.  Left Ear: Hearing normal.  Eyes: Conjunctivae and EOM are normal. Pupils are equal, round, and reactive to light.  Neck: Normal range of motion. Neck supple.  Cardiovascular: Normal rate, regular rhythm, normal heart sounds and intact distal pulses.   Pulmonary/Chest: Effort normal and breath sounds normal.  Abdominal: She exhibits no distension.  Musculoskeletal: Normal range of motion.  BLE NVI  Able to differentiate sharp and dull sensation Ambulates without difficulty; no palpable or visible deformities noted on exam   Neurological: She is alert and oriented to person, place, and time.  Skin: Skin is warm and dry.  Psychiatric: She has a normal mood and affect. Her speech is normal and behavior is normal. Thought content normal.  Nursing note and vitals reviewed.  ED Treatments / Results  DIAGNOSTIC STUDIES:  Oxygen Saturation is 98% on RA, normal by my interpretation.    COORDINATION OF CARE:  2:27 PM Discussed treatment plan with pt at bedside and pt agreed to plan.  Labs (all labs ordered are listed, but only abnormal results are displayed) Labs Reviewed - No data to display  EKG  EKG Interpretation None       Radiology Dg Hand Complete Left  Result Date: 08/08/2016 CLINICAL DATA:  Status post fall yesterday with persistent numbness in the fingers. EXAM: LEFT HAND - COMPLETE 3+ VIEW COMPARISON:  None in PACs FINDINGS: The bones are subjectively adequately mineralized. There is no acute fracture nor dislocation. The joint spaces are well maintained. The soft tissues exhibit no acute abnormalities. IMPRESSION: There is no acute bony abnormality of the left hand. Electronically Signed   By: David  [place] M.D.   On: 08/08/2016 14:19   Dg Hip Unilat  With Pelvis 2-3 Views Right  Result Date: 08/08/2016 CLINICAL DATA:  Status post fall yesterday  persistent pain over the lateral aspect of the right hip. EXAM: DG HIP (WITH OR WITHOUT PELVIS) 2-3V RIGHT COMPARISON:  Scout radiographs from an abdominal and pelvic CT scan of July 12, 2004. FINDINGS: The bony pelvis is subjectively adequately mineralized. There is no acute fracture. There is curvature of the lower lumbar spine convex toward the right with degenerative disc disease noted as well. These findings are not new. AP and lateral views of the right hip reveal preservation of the joint space. The articular surfaces of femoral head and acetabulum remains smoothly rounded. The femoral neck, intertrochanteric, and subtrochanteric regions are normal. IMPRESSION: There is no acute or significant chronic bony abnormality of the right hip. There are degenerative changes of the lower lumbar spine. Electronically Signed   By: David  [place] M.D.   On: 08/08/2016 14:21    Procedures Procedures (  including critical care time)  Medications Ordered in ED Medications - No data to display   Initial Impression / Assessment and Plan / ED Course  I have reviewed the triage vital signs and the nursing notes.  Pertinent labs & imaging results that were available during my care of the patient were reviewed by me and considered in my medical decision making (see chart for details).   {I have reviewed and evaluated the relevant imaging studies.  {I have reviewed the relevant previous healthcare records.  {I obtained HPI from historian.   ED Course:  Assessment: Patient X-Ray negative for obvious fracture or dislocation. No obvious neurovascular abnormalities on exam. Able to differentiate sharp and dull. DTRs intact. Ambulates well. No back pain. No saddle anesthesia or loss of bowel or bladder function. Possible nerve compression from inflamed muscles within fascia. Seems to be intermittent and goes away with rest. Pt advised to follow up with PCP. Conservative therapy recommended and discussed. Patient will  be discharged home & is agreeable with above plan. Returns precautions discussed. Pt appears safe for discharge.  Disposition/Plan:  DC Home Additional Verbal discharge instructions given and discussed with patient.  Pt Instructed to f/u with PCP in the next week for evaluation and treatment of symptoms. Return precautions given Pt acknowledges and agrees with plan  Supervising Physician Raeford Razor, MD  Final Clinical Impressions(s) / ED Diagnoses   Final diagnoses:  Right leg pain  Musculoskeletal pain    New Prescriptions New Prescriptions   No medications on file   I personally performed the services described in this documentation, which was scribed in my presence. The recorded information has been reviewed and is accurate.     Audry Pili, PA-C 08/08/16 1442    Raeford Razor, MD 08/15/16 [phone]

## 2016-08-08 NOTE — ED Triage Notes (Signed)
Pt from home with c/o right leg numbness and left 3rd and 4th digit finger numbness s/p slipping and falling on a concrete/tile surface at work yesterday.  Pt states she was seen yesterday at [facility], had x-rays and given a cortisone shot.  Denies back pain, denies bowel or bladder incontinence.  NAD, A&O, ambulatory.

## 2016-08-12 ENCOUNTER — Emergency Department (HOSPITAL_COMMUNITY): Payer: Worker's Compensation

## 2016-08-12 ENCOUNTER — Encounter (HOSPITAL_COMMUNITY): Payer: Self-pay | Admitting: Emergency Medicine

## 2016-08-12 ENCOUNTER — Emergency Department (HOSPITAL_COMMUNITY)
Admission: EM | Admit: 2016-08-12 | Discharge: 2016-08-12 | Disposition: A | Discharge status: Home / Self Care | Payer: Worker's Compensation | Attending: Emergency Medicine | Admitting: Emergency Medicine

## 2016-08-12 DIAGNOSIS — W1839XA Other fall on same level, initial encounter: Secondary | ICD-10-CM | POA: Diagnosis not present

## 2016-08-12 DIAGNOSIS — R200 Anesthesia of skin: Secondary | ICD-10-CM | POA: Insufficient documentation

## 2016-08-12 DIAGNOSIS — Y9289 Other specified places as the place of occurrence of the external cause: Secondary | ICD-10-CM | POA: Insufficient documentation

## 2016-08-12 DIAGNOSIS — Z79899 Other long term (current) drug therapy: Secondary | ICD-10-CM | POA: Diagnosis not present

## 2016-08-12 DIAGNOSIS — E039 Hypothyroidism, unspecified: Secondary | ICD-10-CM | POA: Insufficient documentation

## 2016-08-12 DIAGNOSIS — M5410 Radiculopathy, site unspecified: Secondary | ICD-10-CM | POA: Diagnosis not present

## 2016-08-12 DIAGNOSIS — Y990 Civilian activity done for income or pay: Secondary | ICD-10-CM | POA: Insufficient documentation

## 2016-08-12 DIAGNOSIS — Y9389 Activity, other specified: Secondary | ICD-10-CM | POA: Insufficient documentation

## 2016-08-12 DIAGNOSIS — S8992XA Unspecified injury of left lower leg, initial encounter: Secondary | ICD-10-CM | POA: Diagnosis present

## 2016-08-12 MED ORDER — KETOROLAC TROMETHAMINE 60 MG/2ML IM SOLN
30.0000 mg | Freq: Once | INTRAMUSCULAR | Status: AC
  Administered 2016-08-12: 30 mg via INTRAMUSCULAR
  Filled 2016-08-12: qty 2

## 2016-08-12 MED ORDER — PREDNISONE 10 MG (21) PO TBPK: ORAL_TABLET | Freq: Every day | ORAL | 0 refills | Status: DC

## 2016-08-12 MED ORDER — TRAMADOL HCL 50 MG PO TABS: 50.0000 mg | ORAL_TABLET | Freq: Four times a day (QID) | ORAL | 0 refills | Status: DC | PRN

## 2016-08-12 NOTE — ED Notes (Signed)
ED Provider at bedside. 

## 2016-08-12 NOTE — Discharge Instructions (Signed)
Take tylenol for pain. Take tramadol for severe pain. Take prednisone as prescribed until all gone. Follow up with family doctor or neurosurgery.

## 2016-08-12 NOTE — ED Triage Notes (Signed)
Pt. Stated, I fell on a wet floor and injured my right leg to the point of not even able to walk.  Its numb and weak.  But more in pain.

## 2016-08-12 NOTE — ED Provider Notes (Signed)
[facility]-EMERGENCY DEPT Provider Note   CSN: [id number] Arrival date & time: 08/12/16  4098     History   Chief Complaint Chief Complaint  Patient presents with  . Leg Pain  . Fall  . Numbness    HPI Gail Mccann is a 56 y.o. female.  HPI Gail Mccann is a 56 y.o. female presents to emergency department complaining of right leg pain. Patient states she fell while at work, states that her right leg bent backwards underneath her and the left leg went forward, and states she ended up in a "split position." This happened 5 days ago. She was immediately having pain in her lower back and right thigh. [facility] and was given a shot of cortisone according to her. She states the next day she developed numbness in the right thigh. She was seen here in emergency department and had negative x-rays of the right hip. She went home with Robaxin and NSAIDs. She reports that she is unable to take Robaxin because it "upsets my stomach." She states that her pain became unbearable yesterday and she went to another urgent care and was told that she needed to come here for further evaluation if she is in not much pain. Today her pain became even worse. She states she is able to bear weight and walk on that leg but reports pain with walking. She denies any weakness or numbness in her foot. She reports complete numbness and tingling sensation in the right lateral thigh. She reports sharp intermittent pains in the right thigh that feels like "lightening bolts." She denies any back pain at this time. She denies any prior similar symptoms. No trouble controlling her bowels or bladder. No other complaints.  Past Medical History:  Diagnosis Date  . Thyroid disease     Patient Active Problem List   Diagnosis Date Noted  . Myalgia and myositis 05/16/2015  . Postmenopausal 05/16/2015  . Acute bronchitis 07/26/2013  . Hypothyroid 05/27/2012    Past Surgical History:  Procedure  Laterality Date  . ABDOMINAL HYSTERECTOMY  2012  . CESAREAN SECTION     x's 2  . NASAL SINUS SURGERY  1986  . TONSILLECTOMY  1968  . TONSILLECTOMY    . TUBAL LIGATION  1986    OB History    No data available       Home Medications    Prior to Admission medications   Medication Sig Start Date End Date Taking? Authorizing Provider  B Complex-Biotin-FA (B-COMPLEX PO) Take by mouth.    Historical Provider, MD  Calcium Carbonate-Vitamin D (CALCIUM 600 + D PO) Take by mouth.    Historical Provider, MD  HYDROcodone-acetaminophen (NORCO) 10-325 MG per tablet Take 1 tablet by mouth every 8 (eight) hours as needed. 11/29/14   Lelon Perla Chase, DO  levothyroxine (SYNTHROID) 50 MCG tablet Take 1 tablet (50 mcg total) by mouth daily before breakfast. 05/16/15   Sunnie Nielsen, DO  liothyronine (CYTOMEL) 25 MCG tablet TAKE ONE TABLET BY MOUTH ONCE DAILY 06/09/15   Sunnie Nielsen, DO  Melatonin 5 MG TABS Take by mouth.    Historical Provider, MD  methocarbamol (ROBAXIN) 500 MG tablet Take 1 tablet (500 mg total) by mouth 2 (two) times daily. 08/08/16   Audry Pili, PA-C  Multiple Vitamin (MULTIVITAMIN) tablet Take 1 tablet by mouth daily.    Historical Provider, MD  Omega-3 Fatty Acids (FISH OIL) 1000 MG CAPS Take by mouth.  Historical Provider, MD  Prenatal Vit-Fe Fumarate-FA (PRENATAL VITAMIN PO) Take by mouth.    Historical Provider, MD    Family History Family History  Problem Relation Age of Onset  . Breast cancer Maternal Aunt   . Diabetes Maternal Grandmother   . Kidney disease Son     Social History Social History  Substance Use Topics  . Smoking status: Never Smoker  . Smokeless tobacco: Never Used  . Alcohol use No     Allergies   Patient has no known allergies.   Review of Systems Review of Systems  Constitutional: Negative for chills and fever.  Respiratory: Negative for cough, chest tightness and shortness of breath.   Cardiovascular: Negative for chest  pain, palpitations and leg swelling.  Gastrointestinal: Negative for abdominal pain, diarrhea, nausea and vomiting.  Musculoskeletal: Positive for arthralgias and myalgias. Negative for neck pain and neck stiffness.  Skin: Negative for rash.  Neurological: Positive for numbness. Negative for dizziness, weakness and headaches.  All other systems reviewed and are negative.    Physical Exam Updated Vital Signs BP (!) 128/97   Pulse 81   Temp 97.8 F (36.6 C) (Oral)   Resp 16   SpO2 96%   Physical Exam  Constitutional: She is oriented to person, place, and time. She appears well-developed and well-nourished. No distress.  HENT:  Head: Normocephalic.  Eyes: Conjunctivae are normal.  Neck: Neck supple.  Cardiovascular: Normal rate, regular rhythm and normal heart sounds.   Pulmonary/Chest: Effort normal and breath sounds normal. No respiratory distress. She has no wheezes. She has no rales.  Musculoskeletal: She exhibits no edema.  Normal-appearing right leg. No obvious swelling, bruising, erythema. Full range of motion of the right hip and right knee. Tenderness to palpation over her right quadricep. There is decreased sensation to the anterior lateral thigh to the level just distal to the knee. Pedal pulses intact and equal bilaterally. Mild midline lumbar spine tenderness. No deformity.  Neurological: She is alert and oriented to person, place, and time.  5/5 and equal lower extremity strength. 2+ and equal patellar reflexes bilaterally. Pt able to dorsiflex bilateral toes and feet with good strength against resistance.   Skin: Skin is warm and dry.  Psychiatric: She has a normal mood and affect. Her behavior is normal.  Nursing note and vitals reviewed.    ED Treatments / Results  Labs (all labs ordered are listed, but only abnormal results are displayed) Labs Reviewed - No data to display  EKG  EKG Interpretation None       Radiology Mr Lumbar Spine Wo  Contrast  Result Date: 08/12/2016 CLINICAL DATA:  Larey Seat on wet floor 5 days ago. Right leg pain and weakness. Unable to walk. EXAM: MRI LUMBAR SPINE WITHOUT CONTRAST TECHNIQUE: Multiplanar, multisequence MR imaging of the lumbar spine was performed. No intravenous contrast was administered. COMPARISON:  CT abdomen pelvis 07/12/2004 FINDINGS: Segmentation: 5 non rib-bearing lumbar type vertebral bodies are present. Alignment: AP alignment is anatomic. Rightward curvature of the lumbar spine is centered at L2-3. Vertebrae: Hemangioma is are most prominent at L2, L3, and L4. Endplate marrow changes are noted on the right at L4-5 and L5-S1. Marrow signal vertebral body heights are otherwise normal. Conus medullaris: Extends to the T12-L1 level and appears normal. Paraspinal and other soft tissues: A 6 mm cyst is seen anteriorly in the right kidney. No other focal lesions are present. There is no significant adenopathy. Disc levels: T12-L1:  Negative. L1-2:  There is  desiccation the disc.  No focal stenosis is present. L2-3: A leftward disc protrusion is present. Asymmetric left-sided facet hypertrophy is noted. There is mild left subarticular narrowing. L3-4: A far left lateral disc protrusion is present. Mild left subarticular and foraminal narrowing is present. Facet hypertrophy contributes. L4-5: A rightward disc protrusion is present. Moderate right and mild left subarticular foraminal stenosis is present. L5-S1: A mild broad-based disc bulge is present. Facet hypertrophy is worse on the right. This results in mild right foraminal narrowing. 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 IMPRESSION: 1. Dextroconvex scoliosis of the lumbar spine is centered at L2. 2. Mild left subarticular and foraminal narrowing at L3-4. 3. Mild left subarticular narrowing at L2-3. 4. Moderate right subarticular and foraminal narrowing at L4-5. Title mild left subarticular and foraminal narrowing at L4-5. 5. Mild right  foraminal narrowing at L5-S1. Electronically Signed   By: Marin Roberts M.D.   On: 08/12/2016 10:08    Procedures Procedures (including critical care time)  Medications Ordered in ED Medications - No data to display   Initial Impression / Assessment and Plan / ED Course  I have reviewed the triage vital signs and the nursing notes.  Pertinent labs & imaging results that were available during my care of the patient were reviewed by me and considered in my medical decision making (see chart for details).     Patient in emergency department with right thigh pain and burning and numbness. She states she feels like electricity jolt or also describes it as "hot water running over my thigh." She does have some decreased sensation to the anterior lateral thigh as well as hypersensitivity with light touch. She is neurovascularly intact with good pulses, good reflexes, strength distally. There is no evidence of infection to the thigh. This did start after a fall, and reports some back pain at that time. She had an x-ray of the hip performed 3 days ago which was negative but showed degenerative changes in her lumbar spine. She is ambulatory with no significant distress and able to put weight on that hip and femur. I doubt fracture of the hip or femur. There is no soft tissue or muscle swelling or bruising. I question whether this burning pain and numbness is coming from her lumbar spine injury. We will get MRI for further evaluation.   11:23 AM MRI as described above. I will start her on a prednisone taper, and will add tramadol for pain. She will be referred to neurosurgery but also explained that she is okay to follow-up with family doctor.  Vitals:   08/12/16 0845 08/12/16 0846 08/12/16 1009 08/12/16 1010  BP: (!) 128/97  125/77   Pulse:  81  79  Resp:      Temp:      TempSrc:      SpO2:  96%  98%     Final Clinical Impressions(s) / ED Diagnoses   Final diagnoses:  Leg numbness   Radicular pain of right lower extremity    New Prescriptions New Prescriptions   PREDNISONE (STERAPRED UNI-PAK 21 TAB) 10 MG (21) TBPK TABLET    Take by mouth daily. Take 6 tabs by mouth daily  for 2 days, then 5 tabs for 2 days, then 4 tabs for 2 days, then 3 tabs for 2 days, 2 tabs for 2 days, then 1 tab by mouth daily for 2 days   TRAMADOL (ULTRAM) 50 MG TABLET    Take 1 tablet (50 mg  total) by mouth every 6 (six) hours as needed.     Jaynie Crumble, PA-C 08/12/16 [address] PA-C 08/12/16 1125    Margarita Grizzle, MD 08/12/16 1558

## 2016-08-12 NOTE — ED Notes (Signed)
Patient transported to MRI 

## 2016-08-12 NOTE — ED Notes (Signed)
Pt returned from MRI °

## 2018-03-10 IMAGING — CR DG HAND COMPLETE 3+V*L*
3 series · 3 of 3 positions shown · non-contrast
Comparison: None in PACs

CLINICAL DATA: Status post fall yesterday with persistent numbness
in the fingers.

EXAM:
LEFT HAND - COMPLETE 3+ VIEW

[hand pa]
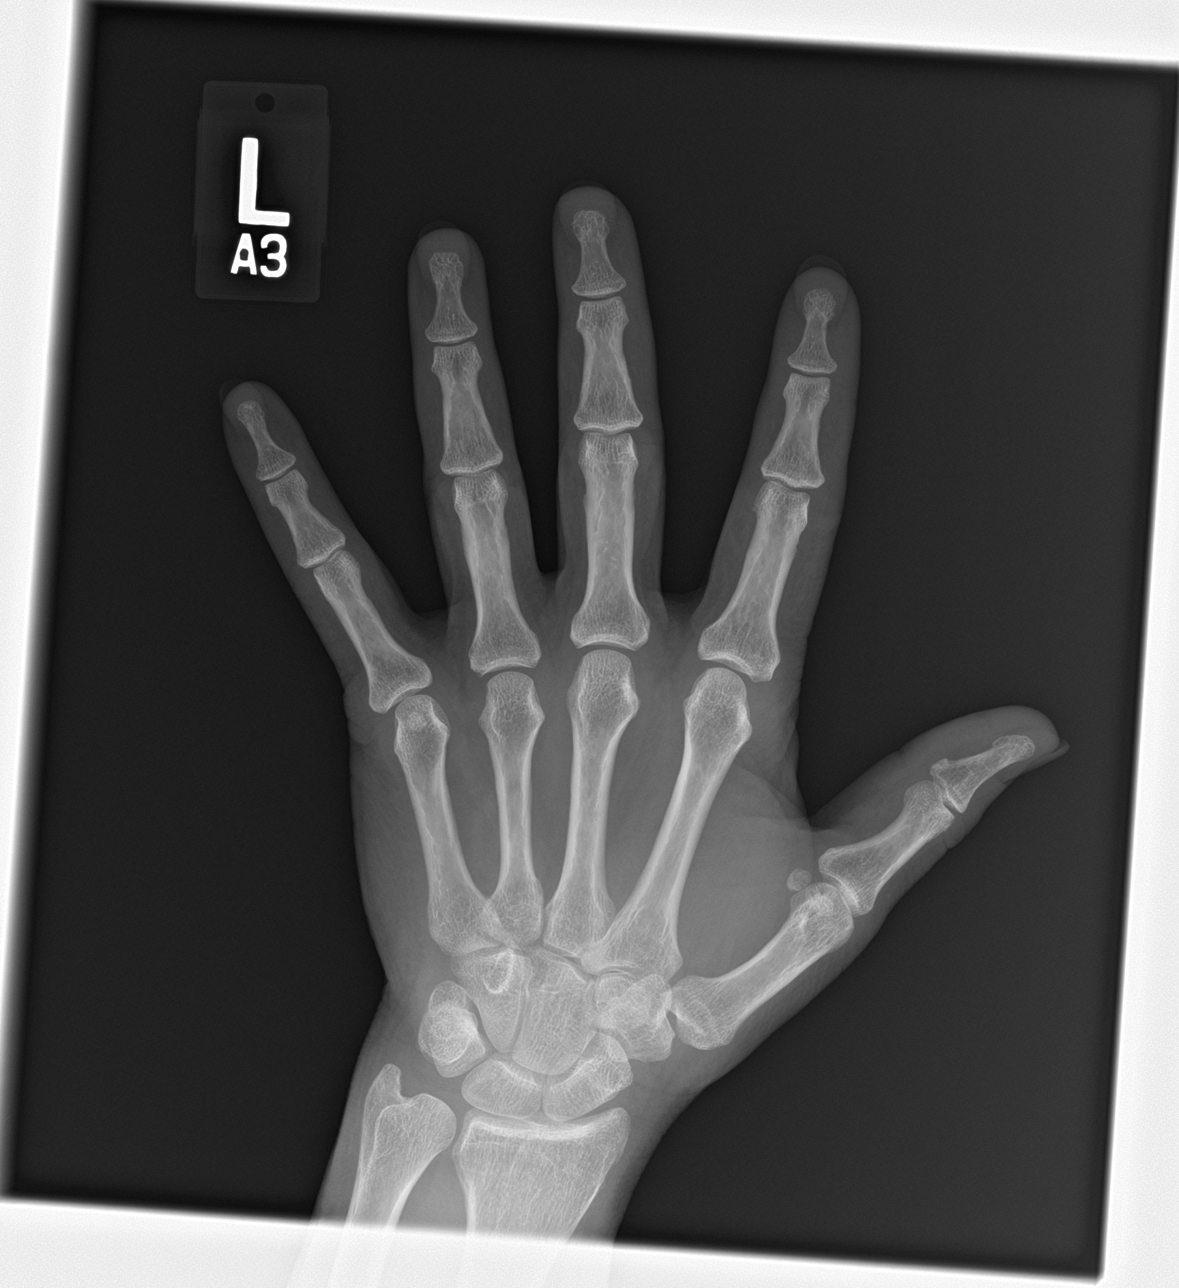

[hand obl]
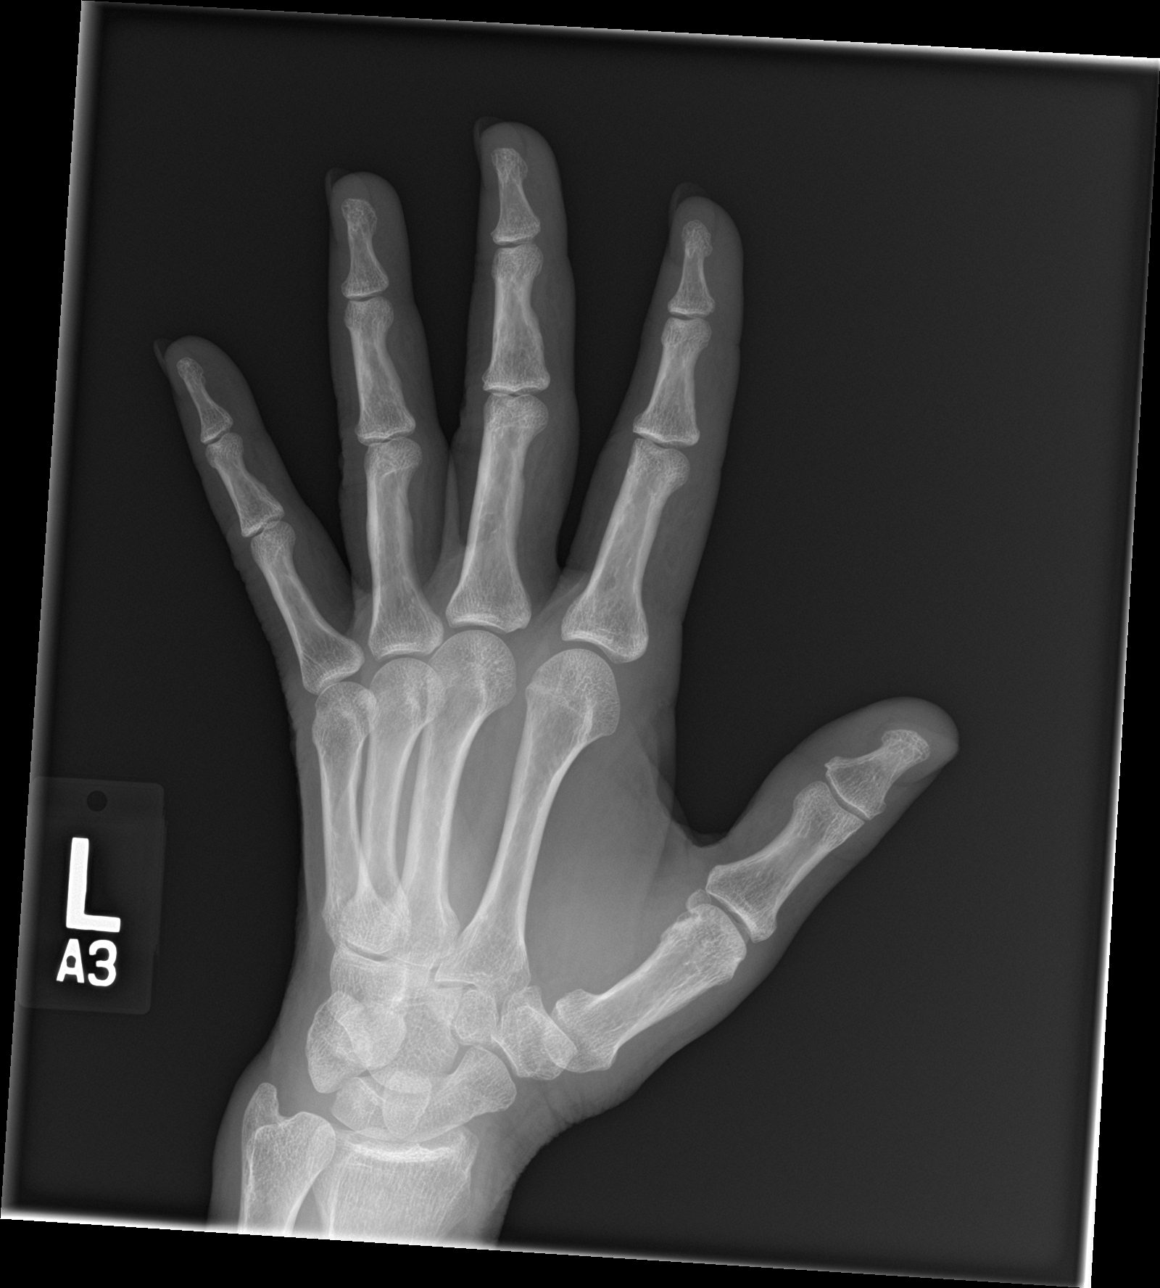

[hand lat]
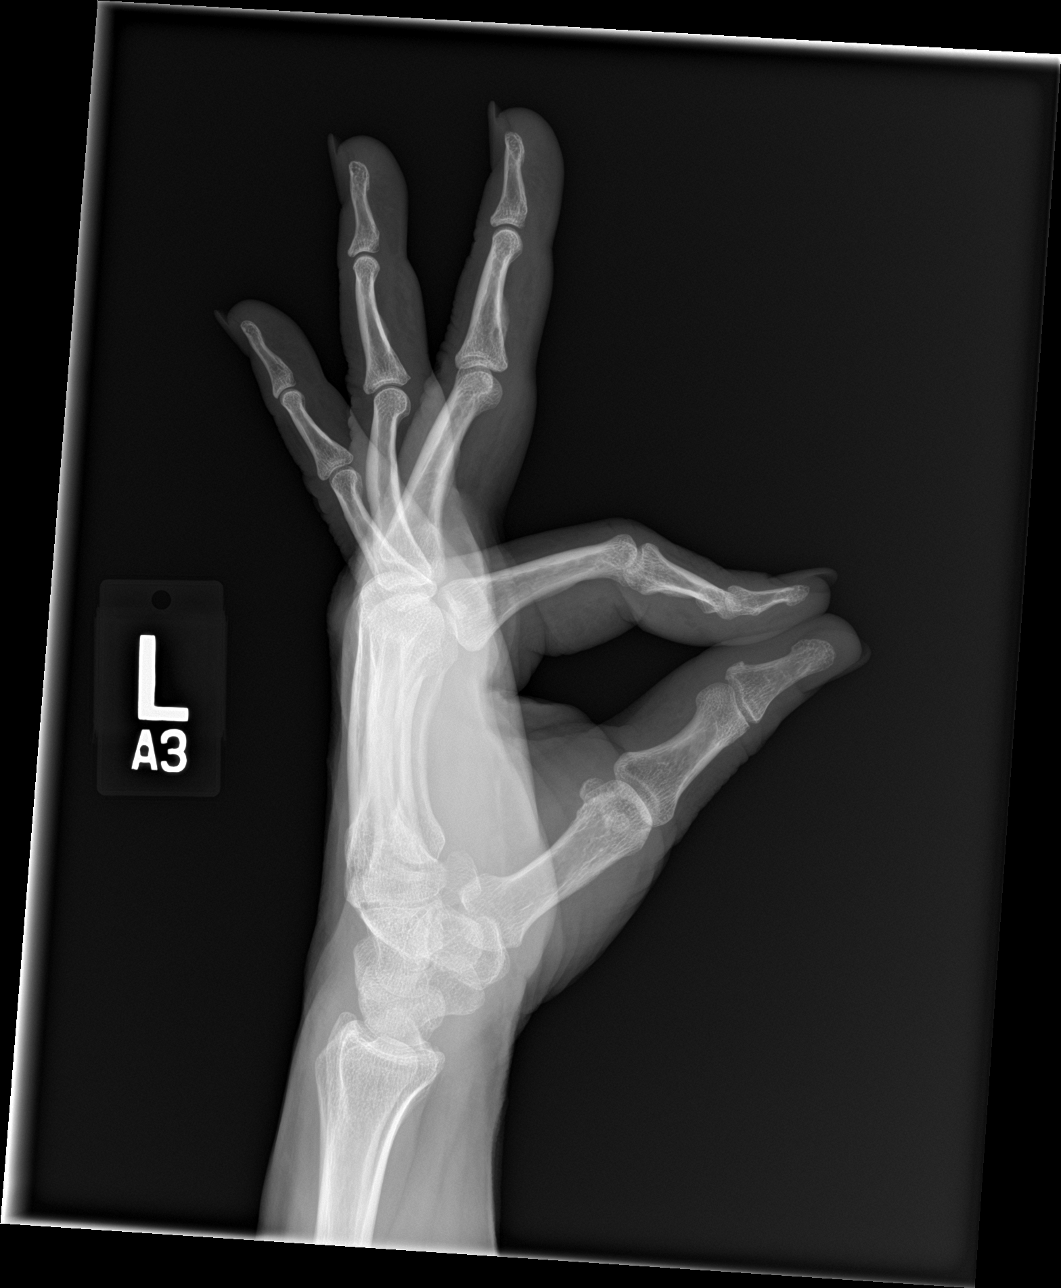

[3 of 3 positions shown; findings below may reference images not displayed]

FINDINGS: The bones are subjectively adequately mineralized. There is no acute
fracture nor dislocation. The joint spaces are well maintained. The
soft tissues exhibit no acute abnormalities.
IMPRESSION: There is no acute bony abnormality of the left hand.

## 2018-03-10 IMAGING — CR DG HIP (WITH OR WITHOUT PELVIS) 2-3V*R*
3 series · 3 of 3 positions shown · non-contrast
Comparison: [facility] radiographs from an abdominal and pelvic CT scan
July 12, 2004.

CLINICAL DATA: Status post fall yesterday persistent pain over the
lateral aspect of the right hip.

EXAM:
DG HIP (WITH OR WITHOUT PELVIS) 2-3V RIGHT

[pelvis ap]
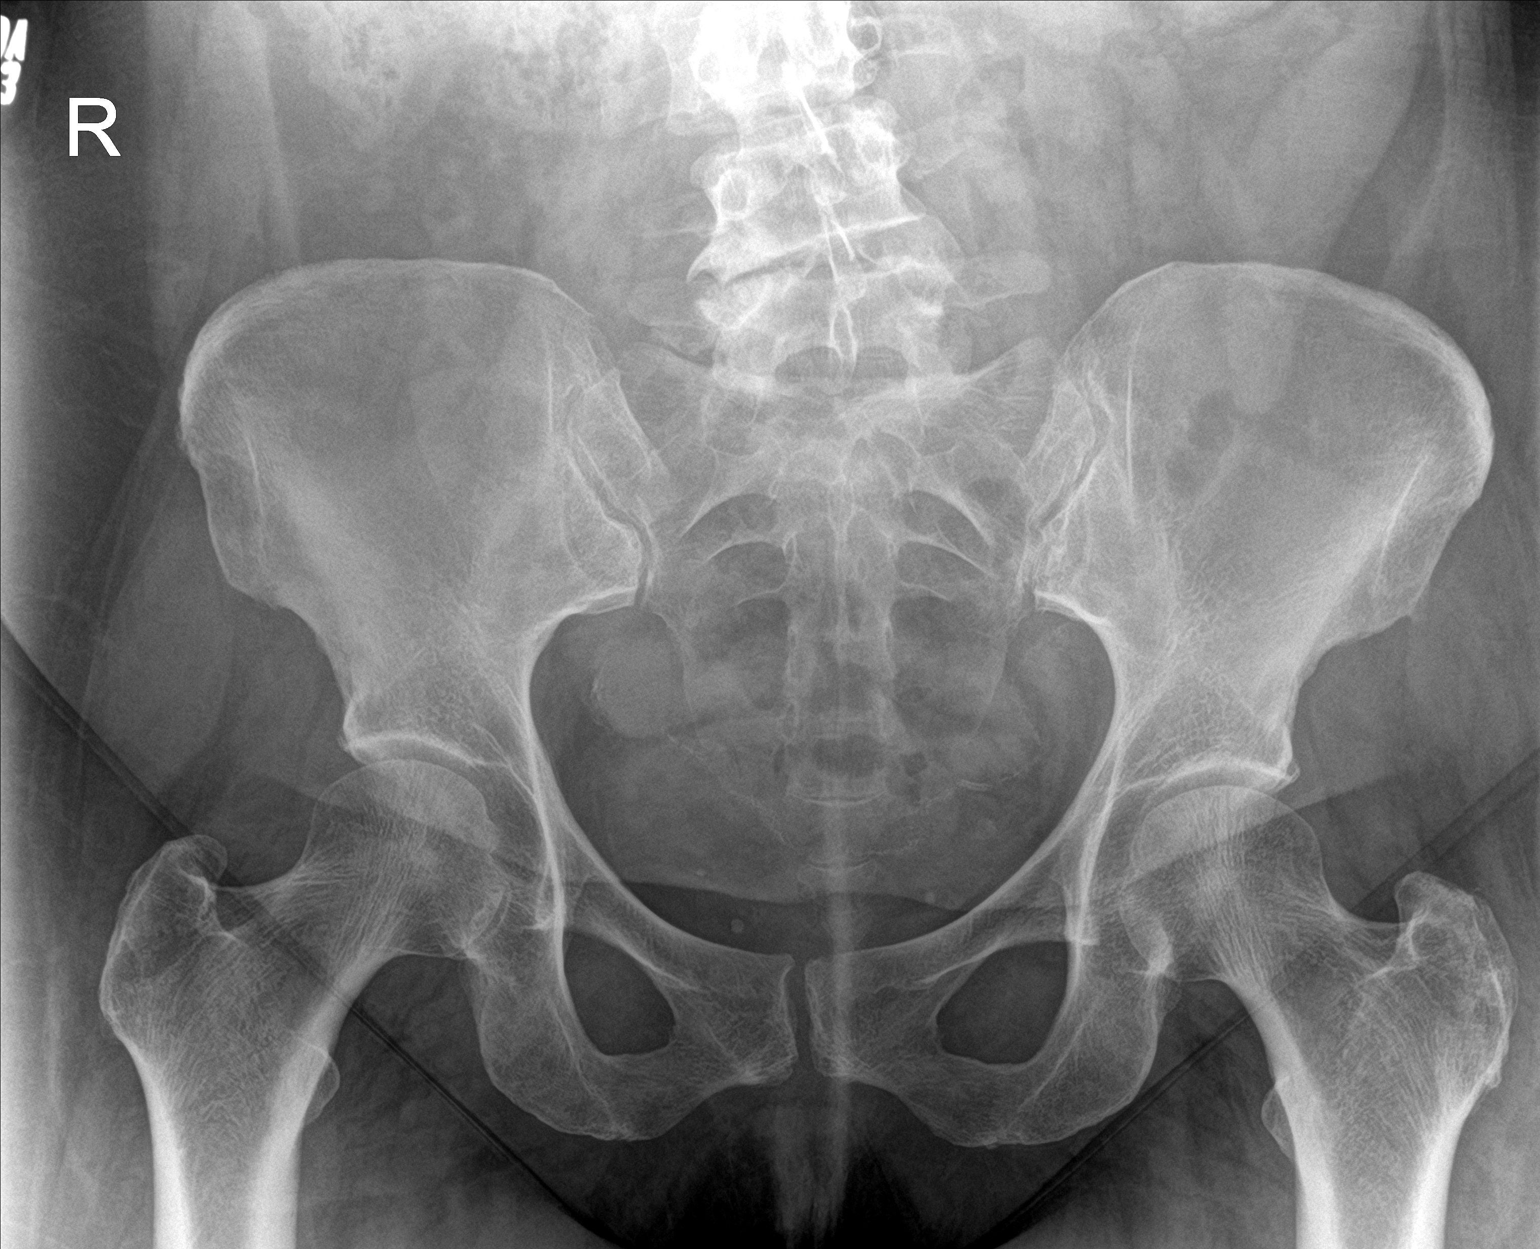

[hip ap]
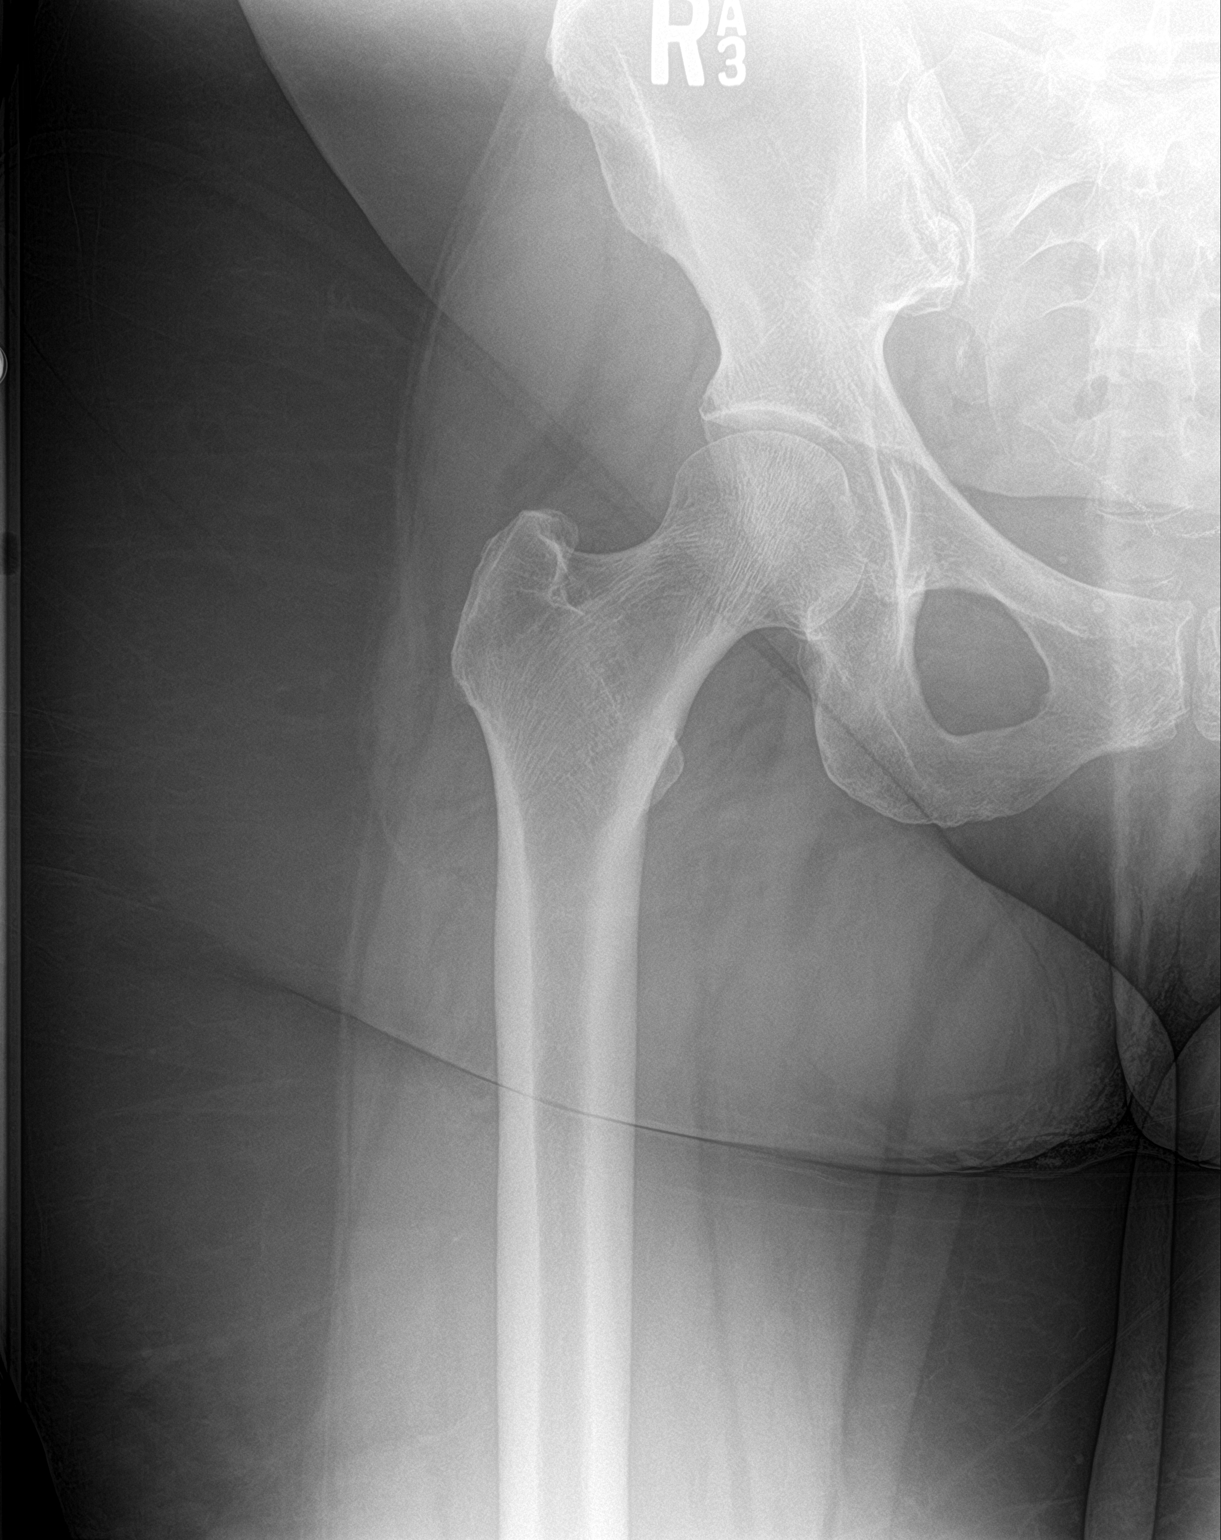

[hip lat]
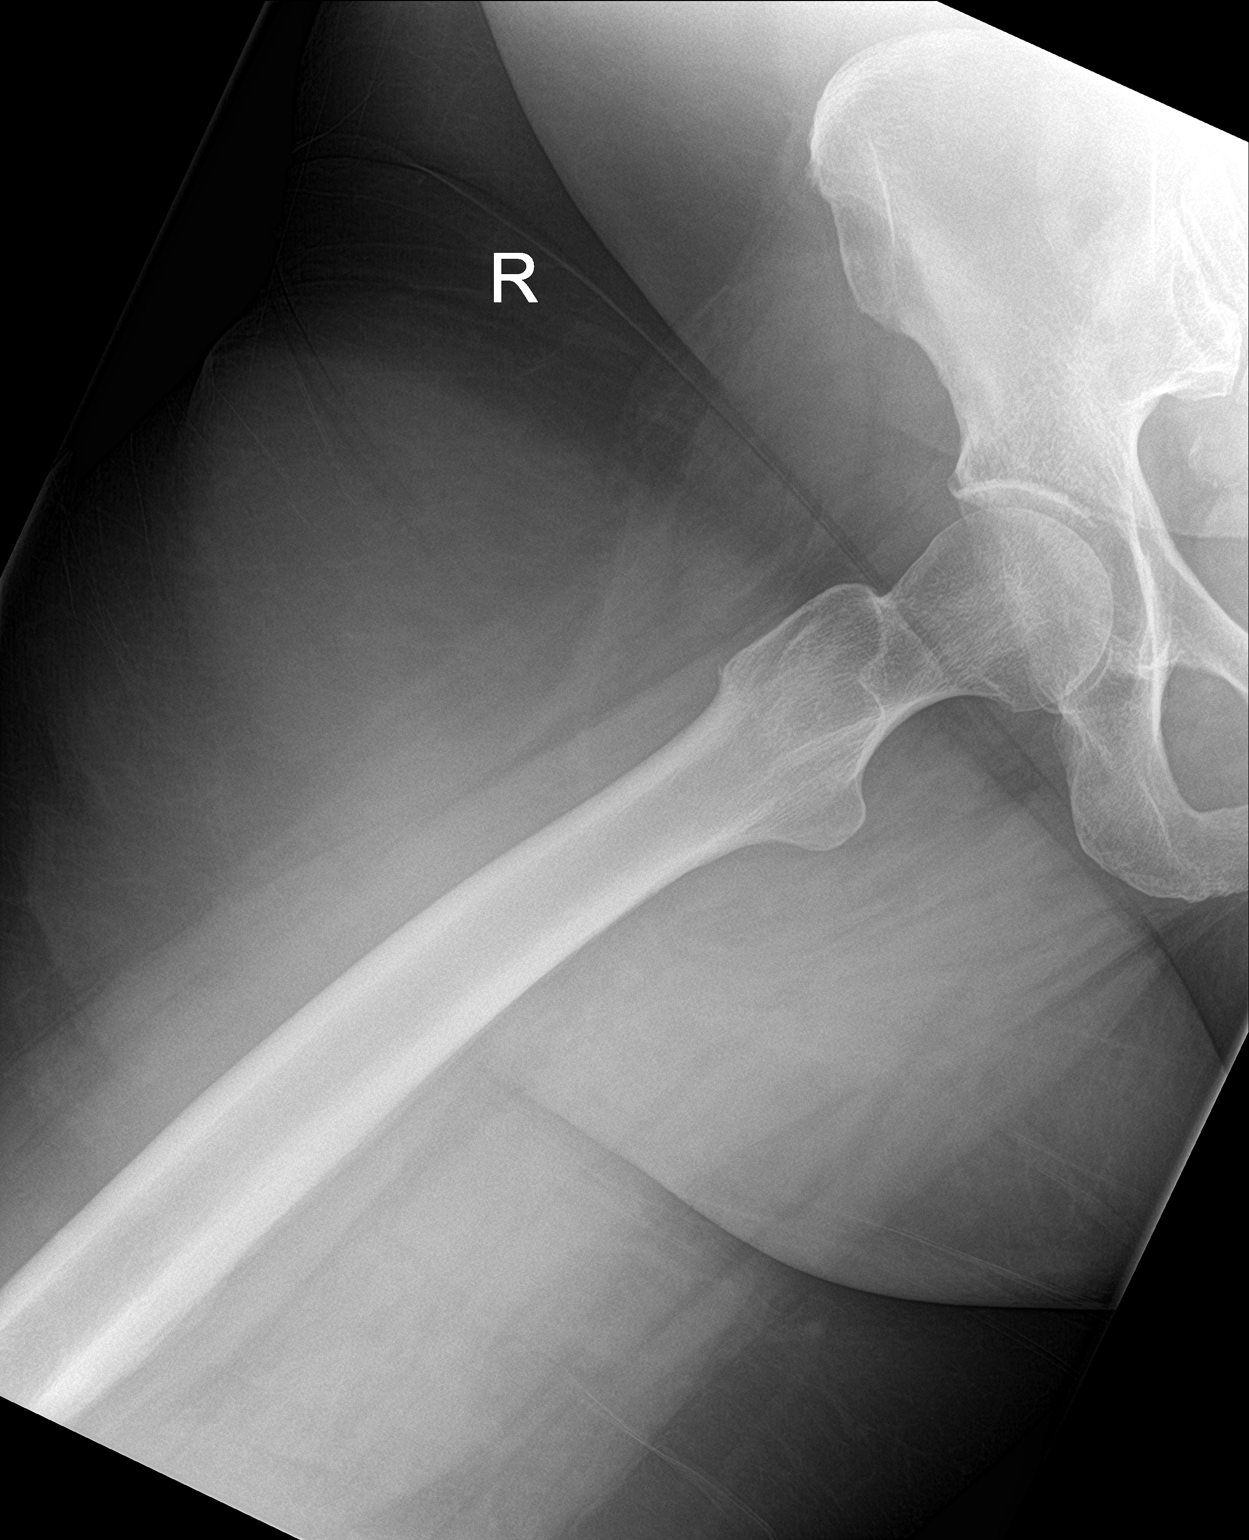

[3 of 3 positions shown; findings below may reference images not displayed]

FINDINGS: The bony pelvis is subjectively adequately mineralized. There is no
acute fracture. There is curvature of the lower lumbar spine convex
toward the right with degenerative disc disease noted as well. These
findings are not new. AP and lateral views of the right hip reveal
preservation of the joint space. The articular surfaces of femoral
head and acetabulum remains smoothly rounded. The femoral neck,
intertrochanteric, and subtrochanteric regions are normal.
IMPRESSION: There is no acute or significant chronic bony abnormality of the
right hip. There are degenerative changes of the lower lumbar spine.

## 2018-12-23 ENCOUNTER — Ambulatory Visit: Payer: No Typology Code available for payment source | Admitting: Pulmonary Disease

## 2018-12-23 ENCOUNTER — Encounter: Payer: Self-pay | Admitting: Pulmonary Disease

## 2018-12-23 ENCOUNTER — Other Ambulatory Visit: Payer: Self-pay

## 2018-12-23 VITALS — BP 114/70 | HR 66 | Temp 98.1°F | Ht 66.0 in | Wt 243.8 lb

## 2018-12-23 DIAGNOSIS — R0609 Other forms of dyspnea: Secondary | ICD-10-CM

## 2018-12-23 LAB — CBC WITH DIFFERENTIAL/PLATELET
Basophils Absolute: 0.1 10*3/uL (ref 0.0–0.1)
Basophils Relative: 1 % (ref 0.0–3.0)
Eosinophils Absolute: 0.2 10*3/uL (ref 0.0–0.7)
Eosinophils Relative: 2.8 % (ref 0.0–5.0)
HCT: 39.2 % (ref 36.0–46.0)
Hemoglobin: 13 g/dL (ref 12.0–15.0)
Lymphocytes Relative: 26.6 % (ref 12.0–46.0)
Lymphs Abs: 1.5 10*3/uL (ref 0.7–4.0)
MCHC: 33.2 g/dL (ref 30.0–36.0)
MCV: 81.7 fl (ref 78.0–100.0)
Monocytes Absolute: 0.4 10*3/uL (ref 0.1–1.0)
Monocytes Relative: 6.5 % (ref 3.0–12.0)
Neutro Abs: 3.5 10*3/uL (ref 1.4–7.7)
Neutrophils Relative %: 63.1 % (ref 43.0–77.0)
Platelets: 263 10*3/uL (ref 150.0–400.0)
RBC: 4.8 Mil/uL (ref 3.87–5.11)
RDW: 15.1 % (ref 11.5–15.5)
WBC: 5.6 10*3/uL (ref 4.0–10.5)

## 2018-12-23 LAB — BRAIN NATRIURETIC PEPTIDE: Pro B Natriuretic peptide (BNP): 80 pg/mL (ref 0.0–100.0)

## 2018-12-23 MED ORDER — BUDESONIDE-FORMOTEROL FUMARATE 160-4.5 MCG/ACT IN AERO: 2.0000 | INHALATION_SPRAY | Freq: Two times a day (BID) | RESPIRATORY_TRACT | 0 refills | Status: AC

## 2018-12-23 MED ORDER — BUDESONIDE-FORMOTEROL FUMARATE 160-4.5 MCG/ACT IN AERO: 2.0000 | INHALATION_SPRAY | Freq: Two times a day (BID) | RESPIRATORY_TRACT | 6 refills | Status: DC

## 2018-12-23 NOTE — Patient Instructions (Signed)
I am sorry you are not doing well with regard to your breathing We will check some labs today including CBC with differential, IgE, d-dimer and a BNP We will schedule you for pulmonary function test Start you on Symbicort 160 Follow-up in 2 to 4 weeks.

## 2018-12-23 NOTE — Addendum Note (Signed)
Addended by: Hildred Alamin I on: 12/23/2018 11:17 AM   Modules accepted: Orders

## 2018-12-23 NOTE — Addendum Note (Signed)
Addended by: Suzzanne Cloud E on: 12/23/2018 11:25 AM   Modules accepted: Orders

## 2018-12-23 NOTE — Progress Notes (Signed)
Gail Mccann    [id number]    04/04/1961  Primary Care Physician:Collins, Annabelle Harmanana, DO  Referring Physician: Trey SailorsPa, [facility] [address] [place] [id number]  Chief complaint: Consult for dyspnea  HPI: 58 year old with history of hypothyroidism.  Complains of dry nonproductive cough with increasing dyspnea on exertion, fatigue since January 2019.  Symptoms associated with wheezing, sensation of chest burning.  She reports about 30 pound weight gain since symptom onset She follows with Dr. Talmage NapBalan, endocrinology for hypothyroidism.  She had her thyroid function tested recently and was told it was normal Tried albuterol inhaler in January with no effect.  Chest x-ray in July was normal.  Pets: No pets Occupation: Works as a Programmer, multimediacustomer representative in [employer] Exposures: No known exposures.  No humidifier, hot tub, Jacuzzi.  She moved to a new apt 1 year ago but does not note any mold there. Smoking history: Never smoker Travel history: No significant travel history Relevant family history: No significant family history of lung disease  Outpatient Encounter Medications as of 12/23/2018  Medication Sig  . albuterol (VENTOLIN HFA) 108 (90 Base) MCG/ACT inhaler INL 2 PFS PO Q 4 TO 6 H PRN  . Calcium Citrate-Vitamin D (CALCIUM + D PO) Take 2 tablets by mouth at bedtime.  . gabapentin (NEURONTIN) 300 MG capsule TK 1 C PO QD PRN  . Multiple Vitamin (MULTIVITAMIN) tablet Take 1 tablet by mouth daily.  . Omega-3 Fatty Acids (FISH OIL) 1000 MG CAPS Take 3,000 mg by mouth at bedtime.   . [DISCONTINUED] cyclobenzaprine (FLEXERIL) 10 MG tablet Take 10 mg by mouth at bedtime.  . [DISCONTINUED] HYDROcodone-acetaminophen (NORCO) 10-325 MG per tablet Take 1 tablet by mouth every 8 (eight) hours as needed. (Patient not taking: Reported on 08/12/2016)  . [DISCONTINUED] ibuprofen (ADVIL,MOTRIN) 200 MG tablet Take 800 mg by mouth every 6 (six)  hours as needed for headache or moderate pain.  . [DISCONTINUED] levothyroxine (SYNTHROID) 50 MCG tablet Take 1 tablet (50 mcg total) by mouth daily before breakfast. (Patient not taking: Reported on 08/12/2016)  . [DISCONTINUED] levothyroxine (SYNTHROID, LEVOTHROID) 88 MCG tablet Take 88 mcg by mouth daily before breakfast.  . [DISCONTINUED] liothyronine (CYTOMEL) 25 MCG tablet TAKE ONE TABLET BY MOUTH ONCE DAILY (Patient not taking: Reported on 08/12/2016)  . [DISCONTINUED] liothyronine (CYTOMEL) 5 MCG tablet Take 5 mcg by mouth 2 (two) times daily.  . [DISCONTINUED] Melatonin 3 MG CAPS Take 6 mg by mouth at bedtime.   . [DISCONTINUED] methocarbamol (ROBAXIN) 500 MG tablet Take 1 tablet (500 mg total) by mouth 2 (two) times daily. (Patient not taking: Reported on 08/12/2016)  . [DISCONTINUED] predniSONE (STERAPRED UNI-PAK 21 TAB) 10 MG (21) TBPK tablet Take by mouth daily. Take 6 tabs by mouth daily  for 2 days, then 5 tabs for 2 days, then 4 tabs for 2 days, then 3 tabs for 2 days, 2 tabs for 2 days, then 1 tab by mouth daily for 2 days  . [DISCONTINUED] Tetrahydrozoline HCl (VISINE OP) Place 1 drop into both eyes as needed (for dry eyes).  . [DISCONTINUED] traMADol (ULTRAM) 50 MG tablet Take 1 tablet (50 mg total) by mouth every 6 (six) hours as needed.   No facility-administered encounter medications on file as of 12/23/2018.     Allergies as of 12/23/2018  . (No Known Allergies)    Past Medical History:  Diagnosis Date  . Thyroid disease  Past Surgical History:  Procedure Laterality Date  . ABDOMINAL HYSTERECTOMY  2012  . CESAREAN SECTION     x's 2  . NASAL SINUS SURGERY  1986  . TONSILLECTOMY  1968  . TONSILLECTOMY    . TUBAL LIGATION  1986    Family History  Problem Relation Age of Onset  . Breast cancer Maternal Aunt   . Diabetes Maternal Grandmother   . Kidney disease Son     Social History   Socioeconomic History  . Marital status: Single    Spouse name: Not on  file  . Number of children: Not on file  . Years of education: Not on file  . Highest education level: Not on file  Occupational History  . Occupation: [employer]    Employer: [employer]  Social Needs  . Financial resource strain: Not on file  . Food insecurity    Worry: Not on file    Inability: Not on file  . Transportation needs    Medical: Not on file    Non-medical: Not on file  Tobacco Use  . Smoking status: Never Smoker  . Smokeless tobacco: Never Used  Substance and Sexual Activity  . Alcohol use: No  . Drug use: No  . Sexual activity: Not on file  Lifestyle  . Physical activity    Days per week: Not on file    Minutes per session: Not on file  . Stress: Not on file  Relationships  . Social Herbalist on phone: Not on file    Gets together: Not on file    Attends religious service: Not on file    Active member of club or organization: Not on file    Attends meetings of clubs or organizations: Not on file    Relationship status: Not on file  . Intimate partner violence    Fear of current or ex partner: Not on file    Emotionally abused: Not on file    Physically abused: Not on file    Forced sexual activity: Not on file  Other Topics Concern  . Not on file  Social History Narrative   Exercise-- walk    Review of systems: Review of Systems  Constitutional: Negative for fever and chills.  HENT: Negative.   Eyes: Negative for blurred vision.  Respiratory: as per HPI  Cardiovascular: Negative for chest pain and palpitations.  Gastrointestinal: Negative for vomiting, diarrhea, blood per rectum. Genitourinary: Negative for dysuria, urgency, frequency and hematuria.  Musculoskeletal: Negative for myalgias, back pain and joint pain.  Skin: Negative for itching and rash.  Neurological: Negative for dizziness, tremors, focal weakness, seizures and loss of consciousness.  Endo/Heme/Allergies: Negative for environmental allergies.  Psychiatric/Behavioral:  Negative for depression, suicidal ideas and hallucinations.  All other systems reviewed and are negative.  Physical Exam: Blood pressure 114/70, pulse 66, temperature 98.1 F (36.7 C), temperature source Temporal, height 5\' 6"  (1.676 m), weight 243 lb 12.8 oz (110.6 kg), SpO2 99 %. Gen:      No acute distress HEENT:  EOMI, sclera anicteric Neck:     No masses; no thyromegaly Lungs:    Clear to auscultation bilaterally; normal respiratory effort CV:         Regular rate and rhythm; no murmurs Abd:      + bowel sounds; soft, non-tender; no palpable masses, no distension Ext:    No edema; adequate peripheral perfusion Skin:      Warm and dry; no rash Neuro:  alert and oriented x 3 Psych: normal mood and affect  Data Reviewed: Imaging: Chest x-ray 01/02/2014-no acute polyp pulmonary disease Chest x-ray 11/26/2018- no acute cardiopulmonary disease I have reviewed the images personally.  Assessment:  Evaluation for dyspnea Unclear etiology for symptoms.  She could have adult onset asthma but her presentation is not very typical We will evaluate with a CBC differential, IgE.  Check d-dimer and BNP to rule out thromboembolism and heart failure Schedule for pulmonary function test Trial Symbicort 160.  Plan/Recommendations: - CBC, IgE, d-dimer, BNP - PFTs - Symbicort  Chilton Greathouse MD [facility] 12/23/2018, 10:39 AM  CC: Pa, [facility]

## 2018-12-24 LAB — D-DIMER, QUANTITATIVE (NOT AT ARMC): D-Dimer, Quant: 0.48 mcg/mL FEU (ref <0.50)

## 2018-12-24 LAB — IGE: IgE (Immunoglobulin E), Serum: 199 kU/L — ABNORMAL HIGH (ref <=114)

## 2020-01-12 ENCOUNTER — Telehealth: Payer: Self-pay | Admitting: Adult Health

## 2020-01-12 ENCOUNTER — Ambulatory Visit (INDEPENDENT_AMBULATORY_CARE_PROVIDER_SITE_OTHER): Payer: No Typology Code available for payment source

## 2020-01-12 ENCOUNTER — Ambulatory Visit: Payer: No Typology Code available for payment source | Admitting: Adult Health

## 2020-01-12 ENCOUNTER — Other Ambulatory Visit: Payer: Self-pay

## 2020-01-12 ENCOUNTER — Encounter: Payer: Self-pay | Admitting: Adult Health

## 2020-01-12 VITALS — BP 112/70 | HR 68 | Temp 97.9°F | Ht 65.5 in | Wt 257.2 lb

## 2020-01-12 DIAGNOSIS — R0600 Dyspnea, unspecified: Secondary | ICD-10-CM | POA: Diagnosis not present

## 2020-01-12 DIAGNOSIS — U071 COVID-19: Secondary | ICD-10-CM

## 2020-01-12 DIAGNOSIS — R05 Cough: Secondary | ICD-10-CM | POA: Diagnosis not present

## 2020-01-12 DIAGNOSIS — R0609 Other forms of dyspnea: Secondary | ICD-10-CM

## 2020-01-12 DIAGNOSIS — R059 Cough, unspecified: Secondary | ICD-10-CM

## 2020-01-12 DIAGNOSIS — R053 Chronic cough: Secondary | ICD-10-CM

## 2020-01-12 LAB — BRAIN NATRIURETIC PEPTIDE: Pro B Natriuretic peptide (BNP): 45 pg/mL (ref 0.0–100.0)

## 2020-01-12 MED ORDER — BUDESONIDE-FORMOTEROL FUMARATE 160-4.5 MCG/ACT IN AERO: 2.0000 | INHALATION_SPRAY | Freq: Two times a day (BID) | RESPIRATORY_TRACT | 6 refills | Status: AC

## 2020-01-12 MED ORDER — BENZONATATE 200 MG PO CAPS: 200.0000 mg | ORAL_CAPSULE | Freq: Three times a day (TID) | ORAL | 1 refills | Status: AC | PRN

## 2020-01-12 NOTE — Progress Notes (Signed)
@Patient  ID: , female    DOB: 02-06-1961, 59 y.o.   MRN: [id number]  Chief Complaint  Patient presents with  . Follow-up    Dyspnea     Referring provider: [id number], DO  HPI: 59 year old female never smoker seen for pulmonary consult December 23, 2018 for shortness of breath  TEST/EVENTS :  August 2020 IgE 199, D-dimer 0.48, eosinophils 200 absolute count Chest x-ray October 02, 2019 left basilar subsegmental pulmonary opacities. 10/02/19 D Dimer 0.30   01/12/2020 Follow up ; Dyspnea  Patient presents for a follow-up visit.  Patient was seen last August 2020 for a pulmonary consult for shortness of breath for 2 years.  She was set up for pulmonary function testing which she did not return for.  Lab work showed a normal D-dimer.  IgE was mildly elevated at 199 and eosinophil absolute count was 200. Complains of dry cough for last year.  She was started on Symbicort took for about 3 months on/off but never had perceived benefit. Continues to have shortness of breath with activity , laughing or talking . Likes walk but had to stop about 2 years ago due to dyspnea.  Cough is dry non productive. Has GERD .  No FH of heart disease .  Feels tired all the time. Wakes up tired and fatigued. . Feels exhausted. No snoring . Restless sleep which she relates to nocturia .  No naps . Does not fall asleep watching tv .   Was seen by PCP with reported normal labs except TSH elevated ~7 , now on Thyroid meds.   Patient did have COVID-17 October 2019.  Chest x-ray showed some left basilar subsegmental pulmonary opacities.  She was treated with albuterol inhlaer.  Was not treated with antibiotics or steroids. Feels about the same except lost smell. No change in dyspnea since Covid .     No Known Allergies  Immunization History  Administered Date(s) Administered  . Influenza Whole 07/30/2011, 01/22/2018  . Influenza-Unspecified 03/15/2015  . Moderna SARS-COVID-2 Vaccination 11/28/2019,  12/26/2019  . Tdap 12/17/2007    Past Medical History:  Diagnosis Date  . Thyroid disease     Tobacco History: Social History   Tobacco Use  Smoking Status Never Smoker  Smokeless Tobacco Never Used   Counseling given: Not Answered   Outpatient Medications Prior to Visit  Medication Sig Dispense Refill  . albuterol (VENTOLIN HFA) 108 (90 Base) MCG/ACT inhaler INL 2 PFS PO Q 4 TO 6 H PRN    . budesonide-formoterol (SYMBICORT) 160-4.5 MCG/ACT inhaler Inhale 2 puffs into the lungs 2 (two) times daily. 1 Inhaler 0  . Calcium Citrate-Vitamin D (CALCIUM + D PO) Take 2 tablets by mouth at bedtime.    . cephALEXin (KEFLEX) 500 MG capsule Take 500 mg by mouth 4 (four) times daily.    12/19/2007 gabapentin (NEURONTIN) 300 MG capsule TK 1 C PO QD PRN    . Multiple Vitamin (MULTIVITAMIN) tablet Take 1 tablet by mouth daily.    Marland Kitchen ofloxacin (OCUFLOX) 0.3 % ophthalmic solution 1 drop 4 (four) times daily.    . Omega-3 Fatty Acids (FISH OIL) 1000 MG CAPS Take 3,000 mg by mouth at bedtime.     Marland Kitchen thyroid (ARMOUR) 60 MG tablet Take 60 mg by mouth daily before breakfast.    . thyroid (NP THYROID) 15 MG tablet Take 15 mg by mouth daily.    . budesonide-formoterol (SYMBICORT) 160-4.5 MCG/ACT inhaler Inhale 2 puffs into the lungs 2 (two)  times daily. 1 Inhaler 6   No facility-administered medications prior to visit.     Review of Systems:   Constitutional:   No  weight loss, night sweats,  Fevers, chills,  +fatigue, or  lassitude.  HEENT:   No headaches,  Difficulty swallowing,  Tooth/dental problems, or  Sore throat,                No sneezing, itching, ear ache, nasal congestion, post nasal drip,   CV:  No chest pain,  Orthopnea, PND, swelling in lower extremities, anasarca, dizziness, palpitations, syncope.   GI  No heartburn, indigestion, abdominal pain, nausea, vomiting, diarrhea, change in bowel habits, loss of appetite, bloody stools.   Resp:  No excess mucus,      No change in color of  mucus.  No wheezing.  No chest wall deformity  Skin: no rash or lesions.  GU: no dysuria, change in color of urine, no urgency or frequency.  No flank pain, no hematuria   MS:  No joint pain or swelling.  No decreased range of motion.  No back pain.    Physical Exam  BP 112/70 (BP Location: Left Arm, Cuff Size: Normal)   Pulse 68   Temp 97.9 F (36.6 C) (Temporal)   Ht 5' 5.5" (1.664 m)   Wt 257 lb 3.2 oz (116.7 kg)   SpO2 100% Comment: RA  BMI 42.15 kg/m   GEN: A/Ox3; pleasant , NAD, well nourished    HEENT:  [place]/AT,  NOSE-clear, THROAT-clear, no lesions, no postnasal drip or exudate noted.   NECK:  Supple w/ fair ROM; no JVD; normal carotid impulses w/o bruits; no thyromegaly or nodules palpated; no lymphadenopathy.    RESP  Clear  P & A; w/o, wheezes/ rales/ or rhonchi. no accessory muscle use, no dullness to percussion  CARD:  RRR, no m/r/g, no peripheral edema, pulses intact, no cyanosis or clubbing.  GI:   Soft & nt; nml bowel sounds; no organomegaly or masses detected.   Musco: Warm bil, no deformities or joint swelling noted.   Neuro: alert, no focal deficits noted.    Skin: Warm, no lesions or rashes    Lab Results:  CBC    Component Value Date/Time   WBC 5.6 12/23/2018 1125   RBC 4.80 12/23/2018 1125   HGB 13.0 12/23/2018 1125   HCT 39.2 12/23/2018 1125   PLT 263.0 12/23/2018 1125   MCV 81.7 12/23/2018 1125   MCV 84 05/16/2015 0000   MCHC 33.2 12/23/2018 1125   RDW 15.1 12/23/2018 1125   LYMPHSABS 1.5 12/23/2018 1125   MONOABS 0.4 12/23/2018 1125   EOSABS 0.2 12/23/2018 1125   BASOSABS 0.1 12/23/2018 1125    BMET    Component Value Date/Time   NA 138 11/29/2014 1335   K 3.9 11/29/2014 1335   CL 102 11/29/2014 1335   CO2 27 11/29/2014 1335   GLUCOSE 76 11/29/2014 1335   BUN 12 05/16/2015 0000   CREATININE 0.7 05/16/2015 0000   CREATININE 0.79 11/29/2014 1335   CALCIUM 9.5 11/29/2014 1335    BNP No results found for: BNP  ProBNP     Component Value Date/Time   PROBNP 80.0 12/23/2018 1125    Imaging: No results found.    No flowsheet data found.  No results found for: NITRICOXIDE      Assessment & Plan:   Dyspnea Dyspnea x2 years questionable etiology.  Symptoms are mainly related to activity.  May have a component of  reactive airways/asthma as she has an associated cough triggers of reflux and possible allergic component with elevated IgE and eosinophils We will restart Symbicort.  Add in Zyrtec and Prilosec for trigger prevention Check chest x-ray and PFTs.  Check BNP Plan  Patient Instructions  Chest xray today.  Restart Symbicort 2 puffs Twice daily  , rinse after use .  Albuterol As needed  Wheezing /shortness of breath  Labs today .  Begin Zyrtec 10mg  At bedtime   Begin Prilosec 20mg  daily before meal .  Delsym 2 tsp Twice daily  For cough As needed   Tessalon Three times a day  As needed  Cough  Follow up in 4 weeks with Dr. with PFTs and As needed   Please contact office for sooner follow up if symptoms do not improve or worsen or seek emergency care           Chronic cough Chronic cough questionable etiology has been going on for greater than a year.  May have be a component from postnasal drainage, GERD and or underlying asthma.  Will check PFTs on return.  Check chest x-ray today. Add in cough control with Delsym and Tessalon Perles.  Plan  Patient Instructions  Chest xray today.  Restart Symbicort 2 puffs Twice daily  , rinse after use .  Albuterol As needed  Wheezing /shortness of breath  Labs today .  Begin Zyrtec 10mg  At bedtime   Begin Prilosec 20mg  daily before meal .  Delsym 2 tsp Twice daily  For cough As needed   Tessalon Three times a day  As needed  Cough  Follow up in 4 weeks with Dr. with PFTs and As needed   Please contact office for sooner follow up if symptoms do not improve or worsen or seek emergency care          COVID-19 virus  infection Patient had COVID-19 infection in June 2021.  Chest x-ray showed some left basilar opacities.  Will repeat chest x-ray today.  Also PFTs on return.     , NP 01/12/2020

## 2020-01-12 NOTE — Assessment & Plan Note (Signed)
Dyspnea x2 years questionable etiology.  Symptoms are mainly related to activity.  May have a component of reactive airways/asthma as she has an associated cough triggers of reflux and possible allergic component with elevated IgE and eosinophils We will restart Symbicort.  Add in Zyrtec and Prilosec for trigger prevention Check chest x-ray and PFTs.  Check BNP Plan  Patient Instructions  Chest xray today.  Restart Symbicort 2 puffs Twice daily  , rinse after use .  Albuterol As needed  Wheezing /shortness of breath  Labs today .  Begin Zyrtec 10mg  At bedtime   Begin Prilosec 20mg  daily before meal .  Delsym 2 tsp Twice daily  For cough As needed   Tessalon Three times a day  As needed  Cough  Follow up in 4 weeks with Dr. with PFTs and As needed   Please contact office for sooner follow up if symptoms do not improve or worsen or seek emergency care

## 2020-01-12 NOTE — Patient Instructions (Addendum)
Chest xray today.  Restart Symbicort 2 puffs Twice daily  , rinse after use .  Albuterol As needed  Wheezing /shortness of breath  Labs today .  Begin Zyrtec 10mg  At bedtime   Begin Prilosec 20mg  daily before meal .  Delsym 2 tsp Twice daily  For cough As needed   Tessalon Three times a day  As needed  Cough  Follow up in 4 weeks with Dr. with PFTs and As needed   Please contact office for sooner follow up if symptoms do not improve or worsen or seek emergency care

## 2020-01-12 NOTE — Assessment & Plan Note (Signed)
Patient had COVID-19 infection in June 2021.  Chest x-ray showed some left basilar opacities.  Will repeat chest x-ray today.  Also PFTs on return.

## 2020-01-12 NOTE — Addendum Note (Signed)
Addended by: Demetrio Lapping E on: 01/12/2020 09:38 AM   Modules accepted: Orders

## 2020-01-12 NOTE — Telephone Encounter (Signed)
Patient contacted that we need a HRCT based on her CXR and history of cough. Patient verbalized understanding, order placed for next available.

## 2020-01-12 NOTE — Assessment & Plan Note (Signed)
Chronic cough questionable etiology has been going on for greater than a year.  May have be a component from postnasal drainage, GERD and or underlying asthma.  Will check PFTs on return.  Check chest x-ray today. Add in cough control with Delsym and Tessalon Perles.  Plan  Patient Instructions  Chest xray today.  Restart Symbicort 2 puffs Twice daily  , rinse after use .  Albuterol As needed  Wheezing /shortness of breath  Labs today .  Begin Zyrtec 10mg  At bedtime   Begin Prilosec 20mg  daily before meal .  Delsym 2 tsp Twice daily  For cough As needed   Tessalon Three times a day  As needed  Cough  Follow up in 4 weeks with Dr. with PFTs and As needed   Please contact office for sooner follow up if symptoms do not improve or worsen or seek emergency care

## 2020-01-12 NOTE — Progress Notes (Signed)
Patient contacted with results of CXR, HRCT ordered. Patient verbalized understanding of results and plan of care.

## 2020-01-25 ENCOUNTER — Other Ambulatory Visit: Payer: No Typology Code available for payment source

## 2020-01-26 ENCOUNTER — Other Ambulatory Visit: Payer: No Typology Code available for payment source

## 2020-01-26 ENCOUNTER — Ambulatory Visit: Payer: No Typology Code available for payment source | Admitting: Pulmonary Disease

## 2020-02-09 ENCOUNTER — Other Ambulatory Visit: Payer: No Typology Code available for payment source

## 2020-02-12 ENCOUNTER — Other Ambulatory Visit: Payer: No Typology Code available for payment source

## 2020-02-18 ENCOUNTER — Other Ambulatory Visit: Payer: Self-pay | Admitting: Pulmonary Disease

## 2020-02-18 ENCOUNTER — Ambulatory Visit: Payer: No Typology Code available for payment source | Admitting: Pulmonary Disease

## 2020-02-23 ENCOUNTER — Telehealth: Payer: Self-pay | Admitting: Endocrinology

## 2020-02-23 NOTE — Telephone Encounter (Signed)
Please decline for me, as I do not recommend this medication
